# Patient Record
Sex: Female | Born: 1985 | Race: Black or African American | Hispanic: No | Marital: Single | Smoking: Current every day smoker
Health system: Southern US, Community
[De-identification: ages and names within clinical notes are randomized; demographics above are authoritative.]

## PROBLEM LIST (undated history)

## (undated) DIAGNOSIS — Z789 Other specified health status: Secondary | ICD-10-CM

## (undated) HISTORY — PX: NO PAST SURGERIES: SHX2092

## (undated) HISTORY — DX: Other specified health status: Z78.9

---

## 2005-07-21 ENCOUNTER — Emergency Department: Payer: Self-pay | Admitting: General Practice

## 2005-08-04 ENCOUNTER — Emergency Department: Payer: Self-pay | Admitting: Unknown Physician Specialty

## 2005-10-18 ENCOUNTER — Emergency Department: Payer: Self-pay | Admitting: Emergency Medicine

## 2006-09-16 ENCOUNTER — Emergency Department: Payer: Self-pay | Admitting: Emergency Medicine

## 2009-07-16 ENCOUNTER — Emergency Department: Payer: Self-pay | Admitting: Emergency Medicine

## 2009-12-11 ENCOUNTER — Emergency Department: Payer: Self-pay | Admitting: Internal Medicine

## 2012-08-08 ENCOUNTER — Emergency Department: Payer: Self-pay | Admitting: Internal Medicine

## 2014-06-03 ENCOUNTER — Emergency Department: Payer: Self-pay | Admitting: Emergency Medicine

## 2014-06-03 LAB — CBC
HCT: 44.4 % (ref 35.0–47.0)
HGB: 14.4 g/dL (ref 12.0–16.0)
MCH: 31 pg (ref 26.0–34.0)
MCHC: 32.5 g/dL (ref 32.0–36.0)
MCV: 96 fL (ref 80–100)
Platelet: 328 10*3/uL (ref 150–440)
RBC: 4.64 10*6/uL (ref 3.80–5.20)
RDW: 13.4 % (ref 11.5–14.5)
WBC: 7.4 10*3/uL (ref 3.6–11.0)

## 2014-06-03 LAB — GC/CHLAMYDIA PROBE AMP

## 2014-06-03 LAB — WET PREP, GENITAL

## 2015-03-26 ENCOUNTER — Ambulatory Visit
Admission: EM | Admit: 2015-03-26 | Discharge: 2015-03-26 | Disposition: A | Discharge status: Home / Self Care | Payer: BLUE CROSS/BLUE SHIELD | Attending: Internal Medicine | Admitting: Internal Medicine

## 2015-03-26 DIAGNOSIS — R112 Nausea with vomiting, unspecified: Secondary | ICD-10-CM | POA: Diagnosis not present

## 2015-03-26 DIAGNOSIS — Z3201 Encounter for pregnancy test, result positive: Secondary | ICD-10-CM | POA: Diagnosis not present

## 2015-03-26 DIAGNOSIS — R102 Pelvic and perineal pain: Secondary | ICD-10-CM | POA: Diagnosis not present

## 2015-03-26 DIAGNOSIS — Z331 Pregnant state, incidental: Secondary | ICD-10-CM | POA: Diagnosis not present

## 2015-03-26 DIAGNOSIS — Z3490 Encounter for supervision of normal pregnancy, unspecified, unspecified trimester: Secondary | ICD-10-CM

## 2015-03-26 DIAGNOSIS — R110 Nausea: Secondary | ICD-10-CM | POA: Diagnosis present

## 2015-03-26 LAB — URINALYSIS COMPLETE WITH MICROSCOPIC (ARMC ONLY)
BILIRUBIN URINE: NEGATIVE
Bacteria, UA: NONE SEEN — AB
GLUCOSE, UA: NEGATIVE mg/dL
Ketones, ur: NEGATIVE mg/dL
Leukocytes, UA: NEGATIVE
NITRITE: NEGATIVE
PH: 7 (ref 5.0–8.0)
Protein, ur: NEGATIVE mg/dL
SPECIFIC GRAVITY, URINE: 1.02 (ref 1.005–1.030)

## 2015-03-26 LAB — HCG, QUANTITATIVE, PREGNANCY: HCG, BETA CHAIN, QUANT, S: 47 m[IU]/mL — AB (ref <5)

## 2015-03-26 LAB — PREGNANCY, URINE: PREG TEST UR: POSITIVE — AB

## 2015-03-26 MED ORDER — ONDANSETRON 8 MG PO TBDP
8.0000 mg | ORAL_TABLET | Freq: Once | ORAL | Status: AC
  Administered 2015-03-26: 8 mg via ORAL

## 2015-03-26 NOTE — ED Provider Notes (Signed)
CSN: [id number]     Arrival date & time 03/26/15  1343 History   First MD Initiated Contact with Patient 03/26/15 1517     Chief Complaint  Patient presents with  . Nausea  . Emesis  . Headache  29 yo F with early AM nausea and vomiting x 2 days-menses due any day-condom contraception- LMP 02/21/15. Works at [employer] -has not felt sick before now (Consider location/radiation/quality/duration/timing/severity/associated sxs/prior Treatment) Patient is a 29 y.o. female presenting with vomiting and headaches. The history is provided by the patient.  Emesis Severity:  Mild Duration:  2 days Timing:  Intermittent Quality:  Stomach contents Able to tolerate:  Liquids Progression:  Unchanged Chronicity:  New Recent urination:  Normal Relieved by:  Nothing Worsened by:  Food smell Ineffective treatments:  None tried Associated symptoms: headaches and myalgias   Associated symptoms: no arthralgias and no diarrhea   Risk factors: sick contacts   Headache Pain location:  Frontal Quality:  Dull Radiates to:  Does not radiate Severity currently:  5/10 Severity at highest:  10/10 Onset quality:  Gradual Similar to prior headaches: yes   Relieved by:  NSAIDs Worsened by:  Activity Associated symptoms: fatigue, myalgias, nausea and vomiting   Associated symptoms: no diarrhea   Vomiting:    Quality:  Stomach contents   Severity:  Mild   Duration:  2 days   Timing:  Sporadic   Progression:  Unchanged   History reviewed. No pertinent past medical history. History reviewed. No pertinent past surgical history. Family History  Problem Relation Age of Onset  . Hypertension Mother    History  Substance Use Topics  . Smoking status: Current Every Day Smoker -- 0.50 packs/day for 5 years  . Smokeless tobacco: Never Used  . Alcohol Use: No   OB History    No data available     Review of Systems  Constitutional: Positive for appetite change and fatigue.  Eyes: Negative.    Cardiovascular: Negative.   Gastrointestinal: Positive for nausea and vomiting. Negative for diarrhea.  Endocrine: Negative.   Genitourinary: Negative.  Negative for vaginal discharge.  Musculoskeletal: Positive for myalgias. Negative for arthralgias.  Skin: Negative.   Allergic/Immunologic: Negative.   Neurological: Positive for light-headedness and headaches.  Hematological: Negative.   Psychiatric/Behavioral: Negative.   All other systems reviewed and are negative. Mild frontal headache, resolved entirely with AM Aleve po. LMP 02/21/2015    Condom contraception   Never pregnant  Allergies  Review of patient's allergies indicates no known allergies.  Home Medications   Prior to Admission medications   Medication Sig Start Date End Date Taking? Authorizing Provider  naproxen sodium (ANAPROX) 220 MG tablet Take 220 mg by mouth 2 (two) times daily with a meal.   Yes Historical Provider, MD   BP 123/78 mmHg  Pulse 76  Temp(Src) 97 F (36.1 C) (Tympanic)  Resp 20  Ht 5\' 2"  (1.575 m)  Wt 165 lb (74.844 kg)  BMI 30.17 kg/m2  SpO2 100%  LMP 02/23/2015 Physical Exam  Constitutional: She is oriented to person, place, and time. She appears well-developed and well-nourished.  HENT:  Head: Normocephalic and atraumatic.  Eyes: EOM are normal.  Neck: Neck supple. No thyromegaly present.  Cardiovascular: Normal rate and regular rhythm.   Pulmonary/Chest: Effort normal and breath sounds normal.  Abdominal: Bowel sounds are normal.  Musculoskeletal: Normal range of motion.  Neurological: She is alert and oriented to person, place, and time. She has normal reflexes. No cranial  nerve deficit.  Skin: Skin is warm and dry.  Psychiatric: She has a normal mood and affect.  Nursing note and vitals reviewed. Nausea reported and mild frontal headache - CNs as tested are normal- no focal changes no visual changes  ED Course  Procedures (including critical care time) Labs Review Labs  Reviewed  URINALYSIS COMPLETEWITH MICROSCOPIC [facility])  - Abnormal; Notable for the following:    Hgb urine dipstick TRACE (*)    Bacteria, UA NONE SEEN (*)    Squamous Epithelial / LPF 0-5 (*)    All other components within normal limits  PREGNANCY, URINE - Abnormal; Notable for the following:    Preg Test, Ur POSITIVE (*)    All other components within normal limits  HCG, QUANTITATIVE, PREGNANCY - Abnormal; Notable for the following:    hCG, Beta Chain, Quant, S 47 (*)    All other components within normal limits   Results for orders placed or performed during the hospital encounter of 03/26/15  Urinalysis complete, with microscopic  Result Value Ref Range   Color, Urine YELLOW YELLOW   APPearance CLEAR CLEAR   Glucose, UA NEGATIVE NEGATIVE mg/dL   Bilirubin Urine NEGATIVE NEGATIVE   Ketones, ur NEGATIVE NEGATIVE mg/dL   Specific Gravity, Urine 1.020 1.005 - 1.030   Hgb urine dipstick TRACE (A) NEGATIVE   pH 7.0 5.0 - 8.0   Protein, ur NEGATIVE NEGATIVE mg/dL   Nitrite NEGATIVE NEGATIVE   Leukocytes, UA NEGATIVE NEGATIVE   RBC / HPF 0-5 <3 RBC/hpf   WBC, UA 0-5 <3 WBC/hpf   Bacteria, UA NONE SEEN (A) RARE   Squamous Epithelial / LPF 0-5 (A) RARE  Pregnancy, urine  Result Value Ref Range   Preg Test, Ur POSITIVE (A) NEGATIVE  hCG, quantitative, pregnancy  Result Value Ref Range   hCG, Beta Chain, Quant, S 47 (H) <5 mIU/mL    Imaging Review No results found.   MDM   1. Pregnancy   2. Nausea and vomiting, vomiting of unspecified type   3. Pelvic pain in female        Rae HalstedLaurie W Calynn Ferrero, Cordelia Poche-C 03/26/15 1950

## 2015-03-26 NOTE — ED Notes (Signed)
Patient with complaint of N/V for the past few days abd pain, pelvic pain.

## 2015-03-26 NOTE — ED Notes (Signed)
Patient feeling better after zofran, nausea gone, given some ginger ale to assess tolerance. Earvin HansenL. Lee aware.

## 2015-03-26 NOTE — Discharge Instructions (Signed)
The results of your pregnancy test indicate that you probably conceived within the last week. The growth and development at this time is still very tiny and an ultrasound tonight  is not likely to be particularly informational. Your pelvic fullness can be associated with the congestion of early pregnancy- just like you reported it feels very much like just before you start your period. If you develop pelvic pain or increased abdominal pain please go directly to the Emergency Room. If you feel well the next 2 days please call the OB-GYN of your choice and establish care. Take the lab information that I gave you with you to that appointment- and you may need to read the numbers to them over the phone. Please remember healthy food choices and no drugs or alcohol. Your OB-GYN will have care information specific to their office when you see them.  Congratulations and best wishes ! Thank you for choosing us for your care this evening !

## 2015-03-28 ENCOUNTER — Emergency Department
Admission: EM | Admit: 2015-03-28 | Discharge: 2015-03-28 | Disposition: A | Discharge status: Home / Self Care | Payer: BLUE CROSS/BLUE SHIELD | Attending: Emergency Medicine | Admitting: Emergency Medicine

## 2015-03-28 ENCOUNTER — Emergency Department: Payer: BLUE CROSS/BLUE SHIELD

## 2015-03-28 DIAGNOSIS — A5901 Trichomonal vulvovaginitis: Secondary | ICD-10-CM | POA: Diagnosis not present

## 2015-03-28 DIAGNOSIS — Z79899 Other long term (current) drug therapy: Secondary | ICD-10-CM | POA: Insufficient documentation

## 2015-03-28 DIAGNOSIS — Z3A00 Weeks of gestation of pregnancy not specified: Secondary | ICD-10-CM | POA: Insufficient documentation

## 2015-03-28 DIAGNOSIS — F17210 Nicotine dependence, cigarettes, uncomplicated: Secondary | ICD-10-CM | POA: Insufficient documentation

## 2015-03-28 DIAGNOSIS — O23591 Infection of other part of genital tract in pregnancy, first trimester: Secondary | ICD-10-CM | POA: Insufficient documentation

## 2015-03-28 DIAGNOSIS — O99331 Smoking (tobacco) complicating pregnancy, first trimester: Secondary | ICD-10-CM | POA: Diagnosis not present

## 2015-03-28 DIAGNOSIS — O9989 Other specified diseases and conditions complicating pregnancy, childbirth and the puerperium: Secondary | ICD-10-CM | POA: Diagnosis present

## 2015-03-28 DIAGNOSIS — O210 Mild hyperemesis gravidarum: Secondary | ICD-10-CM | POA: Insufficient documentation

## 2015-03-28 DIAGNOSIS — Z3491 Encounter for supervision of normal pregnancy, unspecified, first trimester: Secondary | ICD-10-CM

## 2015-03-28 LAB — CBC WITH DIFFERENTIAL/PLATELET
Basophils Absolute: 0.1 10*3/uL (ref 0–0.1)
Basophils Relative: 1 %
EOS PCT: 3 %
Eosinophils Absolute: 0.2 10*3/uL (ref 0–0.7)
HCT: 42.8 % (ref 35.0–47.0)
HEMOGLOBIN: 14 g/dL (ref 12.0–16.0)
LYMPHS PCT: 38 %
Lymphs Abs: 2.5 10*3/uL (ref 1.0–3.6)
MCH: 30.1 pg (ref 26.0–34.0)
MCHC: 32.7 g/dL (ref 32.0–36.0)
MCV: 92 fL (ref 80.0–100.0)
Monocytes Absolute: 0.5 10*3/uL (ref 0.2–0.9)
Monocytes Relative: 7 %
NEUTROS ABS: 3.4 10*3/uL (ref 1.4–6.5)
Neutrophils Relative %: 51 %
Platelets: 393 10*3/uL (ref 150–440)
RBC: 4.65 MIL/uL (ref 3.80–5.20)
RDW: 12.9 % (ref 11.5–14.5)
WBC: 6.7 10*3/uL (ref 3.6–11.0)

## 2015-03-28 LAB — COMPREHENSIVE METABOLIC PANEL
ALT: 18 U/L (ref 14–54)
AST: 23 U/L (ref 15–41)
Albumin: 4.3 g/dL (ref 3.5–5.0)
Alkaline Phosphatase: 59 U/L (ref 38–126)
Anion gap: 8 (ref 5–15)
BUN: 13 mg/dL (ref 6–20)
CHLORIDE: 106 mmol/L (ref 101–111)
CO2: 21 mmol/L — AB (ref 22–32)
CREATININE: 0.64 mg/dL (ref 0.44–1.00)
Calcium: 9.3 mg/dL (ref 8.9–10.3)
GFR calc Af Amer: >60 mL/min (ref >60)
Glucose, Bld: 107 mg/dL — ABNORMAL HIGH (ref 65–99)
Potassium: 3.9 mmol/L (ref 3.5–5.1)
Sodium: 135 mmol/L (ref 135–145)
Total Bilirubin: 0.6 mg/dL (ref 0.3–1.2)
Total Protein: 7.5 g/dL (ref 6.5–8.1)

## 2015-03-28 LAB — URINALYSIS COMPLETE WITH MICROSCOPIC (ARMC ONLY)
Bilirubin Urine: NEGATIVE
Glucose, UA: NEGATIVE mg/dL
KETONES UR: NEGATIVE mg/dL
Leukocytes, UA: NEGATIVE
NITRITE: NEGATIVE
Protein, ur: NEGATIVE mg/dL
Specific Gravity, Urine: 1.018 (ref 1.005–1.030)
pH: 6 (ref 5.0–8.0)

## 2015-03-28 LAB — WET PREP, GENITAL
Clue Cells Wet Prep HPF POC: NONE SEEN
Yeast Wet Prep HPF POC: NONE SEEN

## 2015-03-28 LAB — OB RESULTS CONSOLE GC/CHLAMYDIA
CHLAMYDIA, DNA PROBE: NEGATIVE
GC PROBE AMP, GENITAL: NEGATIVE

## 2015-03-28 LAB — CHLAMYDIA/NGC RT PCR (ARMC ONLY)
Chlamydia Tr: NOT DETECTED
N gonorrhoeae: NOT DETECTED

## 2015-03-28 LAB — PREGNANCY, URINE: Preg Test, Ur: POSITIVE — AB

## 2015-03-28 LAB — LIPASE, BLOOD: LIPASE: 26 U/L (ref 22–51)

## 2015-03-28 LAB — HCG, QUANTITATIVE, PREGNANCY: hCG, Beta Chain, Quant, S: 107 m[IU]/mL — ABNORMAL HIGH (ref <5)

## 2015-03-28 MED ORDER — ONDANSETRON 4 MG PO TBDP
ORAL_TABLET | ORAL | Status: AC
  Administered 2015-03-28: 4 mg via ORAL
  Filled 2015-03-28: qty 4

## 2015-03-28 MED ORDER — ONDANSETRON 4 MG PO TBDP
4.0000 mg | ORAL_TABLET | Freq: Once | ORAL | Status: AC
  Administered 2015-03-28: 4 mg via ORAL

## 2015-03-28 MED ORDER — METRONIDAZOLE 500 MG PO TABS
2000.0000 mg | ORAL_TABLET | Freq: Once | ORAL | Status: AC
  Administered 2015-03-28: 2000 mg via ORAL

## 2015-03-28 MED ORDER — ONDANSETRON HCL 4 MG PO TABS: 4.0000 mg | ORAL_TABLET | Freq: Every day | ORAL | Status: DC | PRN

## 2015-03-28 MED ORDER — METRONIDAZOLE 500 MG PO TABS
ORAL_TABLET | ORAL | Status: AC
  Administered 2015-03-28: 2000 mg via ORAL
  Filled 2015-03-28: qty 4

## 2015-03-28 NOTE — ED Provider Notes (Signed)
[facility] Emergency Department Provider Note    Time seen: 12:00   I have reviewed the triage vital signs and the nursing notes.   HISTORY  Chief Complaint Abdominal Pain    HPI Isabel Weber is a [age] y.o. female who presents here for abdominal pain and vomiting for the last week. Patient states pain is suprapubic sharp, nothing makes it better or worse. States that she just found out at urgent care today that she is pregnant. Also complains of pain in the pelvic region that slight pulling. Pain is currently mild does not want anything for pain. This is her first pregnancy is is G1 P0    History reviewed. No pertinent past medical history.  There are no active problems to display for this patient.   History reviewed. No pertinent past surgical history.  Current Outpatient Rx  Name  Route  Sig  Dispense  Refill  . Prenatal Vit-Fe Fumarate-FA (PRENATAL MULTIVITAMIN) TABS tablet   Oral   Take 1 tablet by mouth daily at 12 noon.         . naproxen sodium (ANAPROX) 220 MG tablet   Oral   Take 220 mg by mouth 2 (two) times daily as needed (pain).            Allergies Review of patient's allergies indicates no known allergies.  Family History  Problem Relation Age of Onset  . Hypertension Mother     Social History History  Substance Use Topics  . Smoking status: Current Every Day Smoker -- 0.50 packs/day for 5 years    Types: Cigarettes  . Smokeless tobacco: Never Used  . Alcohol Use: No    Review of Systems Constitutional: Negative for fever. Eyes: Negative for visual changes. ENT: Negative for sore throat. Cardiovascular: Negative for chest pain. Respiratory: Negative for shortness of breath. Gastrointestinal: Abdominal pain, positive for vomiting and negative for diarrhea. Genitourinary: Negative for dysuria, denies any vaginal bleeding Musculoskeletal: Negative for back pain. Skin: Negative for rash. Neurological: Negative  for headaches, focal weakness or numbness.  10-point ROS otherwise negative.  ____________________________________________   PHYSICAL EXAM:  VITAL SIGNS: ED Triage Vitals  Enc Vitals Group     BP 03/28/15 1055 127/78 mmHg     Pulse Rate 03/28/15 1055 86     Resp 03/28/15 1055 18     Temp 03/28/15 1055 98.2 F (36.8 C)     Temp Source 03/28/15 1055 Oral     SpO2 03/28/15 1055 100 %     Weight 03/28/15 1055 165 lb (74.844 kg)     Height 03/28/15 1055 5\' 2"  (1.575 m)     Head Cir --      Peak Flow --      Pain Score 03/28/15 1055 5     Pain Loc --      Pain Edu? --      Excl. in GC? --     Constitutional: Alert and oriented. Well appearing and in no distress. Eyes: Conjunctivae are normal. PERRL. Normal extraocular movements. ENT   Head: Normocephalic and atraumatic.   Nose: No congestion/rhinnorhea.   Mouth/Throat: Mucous membranes are moist.   Neck: No stridor. Hematological/Lymphatic/Immunilogical: No cervical lymphadenopathy. Cardiovascular: Normal rate, regular rhythm. Normal and symmetric distal pulses are present in all extremities. No murmurs, rubs, or gallops. Respiratory: Normal respiratory effort without tachypnea nor retractions. Breath sounds are clear and equal bilaterally. No wheezes/rales/rhonchi. Gastrointestinal: Soft and nontender. No distention. No abdominal bruits. There is  no CVA tenderness. Genitourinary: There is no cervical motion tenderness no signs of cervicitis no adnexal tenderness no vaginal discharge. Musculoskeletal: Nontender with normal range of motion in all extremities. No joint effusions.  No lower extremity tenderness nor edema. Neurologic:  Normal speech and language. No gross focal neurologic deficits are appreciated. Speech is normal. No gait instability. Skin:  Skin is warm, dry and intact. No rash noted. Psychiatric: Mood and affect are normal. Speech and behavior are normal. Patient exhibits appropriate insight and  judgment.  ____________________________________________    LABS (pertinent positives/negatives)  Labs Reviewed  WET PREP, GENITAL - Abnormal; Notable for the following:    Trich, Wet Prep MODERATE (*)    WBC, Wet Prep HPF POC FEW (*)    All other components within normal limits  COMPREHENSIVE METABOLIC PANEL - Abnormal; Notable for the following:    CO2 21 (*)    Glucose, Bld 107 (*)    All other components within normal limits  URINALYSIS COMPLETEWITH MICROSCOPIC ([facility])  - Abnormal; Notable for the following:    Color, Urine YELLOW (*)    APPearance CLEAR (*)    Hgb urine dipstick 1+ (*)    Bacteria, UA RARE (*)    Squamous Epithelial / LPF 0-5 (*)    All other components within normal limits  PREGNANCY, URINE - Abnormal; Notable for the following:    Preg Test, Ur POSITIVE (*)    All other components within normal limits  CHLAMYDIA/NGC RT PCR ([facility])   CBC WITH DIFFERENTIAL/PLATELET  CBC WITH DIFFERENTIAL/PLATELET  LIPASE, BLOOD  RPR  HCG, QUANTITATIVE, PREGNANCY  GC/CHLAMYDIA PROBE AMP ([place])     ____________________________________________    RADIOLOGY  Pelvic ultrasound:  ____________________________________________    ED COURSE  Pertinent labs & imaging results that were available during my care of the patient were reviewed by me and considered in my medical decision making (see chart for details).  We'll perform basic tests and ultrasound will reevaluate.  FINAL ASSESSMENT AND PLAN  Assessment: First trimester pregnancy and Trichomonas  Plan: Patient is given 2 g of Flagyl here by mouth and we'll prescribe Zofran as needed for nausea and vomiting in pregnancy she is in no acute distress she is advised to abstain from sexual activity until her sexual partners treated.    Emily FilbertWilliams, Jonathan E, MD   Emily FilbertJonathan E Williams, MD 03/28/15 [phone]

## 2015-03-28 NOTE — ED Notes (Signed)
Pt reports abdominal pain and vomiting x 1 week. Reports went to urgent care where they told her she was pregnant and sent here for US evaluation to rule out ectopic due to patient complaining of pain in pelvic region "like pulling".

## 2015-03-28 NOTE — Discharge Instructions (Signed)
Trichomoniasis Trichomoniasis is an infection caused by an organism called Trichomonas. The infection can affect both women and men. In women, the outer female genitalia and the vagina are affected. In men, the penis is mainly affected, but the prostate and other reproductive organs can also be involved. Trichomoniasis is a sexually transmitted infection (STI) and is most often passed to another person through sexual contact.  RISK FACTORS  Having unprotected sexual intercourse.  Having sexual intercourse with an infected partner. SIGNS AND SYMPTOMS  Symptoms of trichomoniasis in women include:  Abnormal gray-green frothy vaginal discharge.  Itching and irritation of the vagina.  Itching and irritation of the area outside the vagina. Symptoms of trichomoniasis in men include:   Penile discharge with or without pain.  Pain during urination. This results from inflammation of the urethra. DIAGNOSIS  Trichomoniasis may be found during a Pap test or physical exam. Your health care provider may use one of the following methods to help diagnose this infection:  Examining vaginal discharge under a microscope. For men, urethral discharge would be examined.  Testing the pH of the vagina with a test tape.  Using a vaginal swab test that checks for the Trichomonas organism. A test is available that provides results within a few minutes.  Doing a culture test for the organism. This is not usually needed. TREATMENT   You may be given medicine to fight the infection. Women should inform their health care provider if they could be or are pregnant. Some medicines used to treat the infection should not be taken during pregnancy.  Your health care provider may recommend over-the-counter medicines or creams to decrease itching or irritation.  Your sexual partner will need to be treated if infected. HOME CARE INSTRUCTIONS   Take medicines only as directed by your health care provider.  Take  over-the-counter medicine for itching or irritation as directed by your health care provider.  Do not have sexual intercourse while you have the infection.  Women should not douche or wear tampons while they have the infection.  Discuss your infection with your partner. Your partner may have gotten the infection from you, or you may have gotten it from your partner.  Have your sex partner get examined and treated if necessary.  Practice safe, informed, and protected sex.  See your health care provider for other STI testing. SEEK MEDICAL CARE IF:   You still have symptoms after you finish your medicine.  You develop abdominal pain.  You have pain when you urinate.  You have bleeding after sexual intercourse.  You develop a rash.  Your medicine makes you sick or makes you throw up (vomit). MAKE SURE YOU:  Understand these instructions.  Will watch your condition.  Will get help right away if you are not doing well or get worse. Document Released: 05/01/2001 Document Revised: 03/22/2014 Document Reviewed: 08/17/2013 [facility] Patient Information 2015 [place], [place]. This information is not intended to replace advice given to you by your health care provider. Make sure you discuss any questions you have with your health care provider.  Prenatal Care  WHAT IS PRENATAL CARE?  Prenatal care means health care during your pregnancy, before your baby is born. It is very important to take care of yourself and your baby during your pregnancy by:   Getting early prenatal care. If you know you are pregnant, or think you might be pregnant, call your health care provider as soon as possible. Schedule a visit for a prenatal exam.  Getting  regular prenatal care. Follow your health care provider's schedule for blood and other necessary tests. Do not miss appointments.  Doing everything you can to keep yourself and your baby healthy during your pregnancy.  Getting complete care. Prenatal  care should include evaluation of the medical, dietary, educational, psychological, and social needs of you and your significant other. The medical and genetic history of your family and the family of your baby's father should be discussed with your health care provider.  Discussing with your health care provider:  Prescription, over-the-counter, and herbal medicines that you take.  Any history of substance abuse, alcohol use, smoking, and illegal drug use.  Any history of domestic abuse and violence.  Immunizations you have received.  Your nutrition and diet.  The amount of exercise you do.  Any environmental and occupational hazards to which you are exposed.  History of sexually transmitted infections for both you and your partner.  Previous pregnancies you have had. WHY IS PRENATAL CARE SO IMPORTANT?  By regularly seeing your health care provider, you help ensure that problems can be identified early so that they can be treated as soon as possible. Other problems might be prevented. Many studies have shown that early and regular prenatal care is important for the health of mothers and their babies.  HOW CAN I TAKE CARE OF MYSELF WHILE I AM PREGNANT?  Here are ways to take care of yourself and your baby:   Start or continue taking your multivitamin with 400 micrograms (mcg) of folic acid every day.  Get early and regular prenatal care. It is very important to see a health care provider during your pregnancy. Your health care provider will check at each visit to make sure that you and your baby are healthy. If there are any problems, action can be taken right away to help you and your baby.  Eat a healthy diet that includes:  Fruits.  Vegetables.  Foods low in saturated fat.  Whole grains.  Calcium-rich foods, such as milk, yogurt, and hard cheeses.  Drink 6-8 glasses of liquids a day.  Unless your health care provider tells you not to, try to be physically active for 30  minutes, most days of the week. If you are pressed for time, you can get your activity in through 10-minute segments, three times a day.  Do not smoke, drink alcohol, or use drugs. These can cause long-term damage to your baby. Talk with your health care provider about steps to take to stop smoking. Talk with a member of your faith community, a counselor, a trusted friend, or your health care provider if you are concerned about your alcohol or drug use.  Ask your health care provider before taking any medicine, even over-the-counter medicines. Some medicines are not safe to take during pregnancy.  Get plenty of rest and sleep.  Avoid hot tubs and saunas during pregnancy.  Do not have X-rays taken unless absolutely necessary and with the recommendation of your health care provider. A lead shield can be placed on your abdomen to protect your baby when X-rays are taken in other parts of your body.  Do not empty the cat litter when you are pregnant. It may contain a parasite that causes an infection called toxoplasmosis, which can cause birth defects. Also, use gloves when working in garden areas used by cats.  Do not eat uncooked or undercooked meats or fish.  Do not eat soft, mold-ripened cheeses (Brie, Camembert, and chevre) or soft, blue-veined cheese (  Danish blue and Roquefort).  Stay away from toxic chemicals like:  Insecticides.  Solvents (some cleaners or paint thinners).  Lead.  Mercury.  Sexual intercourse may continue until the end of the pregnancy, unless you have a medical problem or there is a problem with the pregnancy and your health care provider tells you not to.  Do not wear high-heel shoes, especially during the second half of the pregnancy. You can lose your balance and fall.  Do not take long trips, unless absolutely necessary. Be sure to see your health care provider before going on the trip.  Do not sit in one position for more than 2 hours when on a  trip.  Take a copy of your medical records when going on a trip. Know where a hospital is located in the city you are visiting, in case of an emergency.  Most dangerous household products will have pregnancy warnings on their labels. Ask your health care provider about products if you are unsure.  Limit or eliminate your caffeine intake from coffee, tea, sodas, medicines, and chocolate.  Many women continue working through pregnancy. Staying active might help you stay healthier. If you have a question about the safety or the hours you work at your particular job, talk with your health care provider.  Get informed:  Read books.  Watch videos.  Go to childbirth classes for you and your significant other.  Talk with experienced moms.  Ask your health care provider about childbirth education classes for you and your partner. Classes can help you and your partner prepare for the birth of your baby.  Ask about a baby doctor (pediatrician) and methods and pain medicine for labor, delivery, and possible cesarean delivery. HOW OFTEN SHOULD I SEE MY HEALTH CARE PROVIDER DURING PREGNANCY?  Your health care provider will give you a schedule for your prenatal visits. You will have visits more often as you get closer to the end of your pregnancy. An average pregnancy lasts about 40 weeks.  A typical schedule includes visiting your health care provider:   About once each month during your first 6 months of pregnancy.  Every 2 weeks during the next 2 months.  Weekly in the last month, until the delivery date. Your health care provider will probably want to see you more often if:  You are older than 35 years.  Your pregnancy is high risk because you have certain health problems or problems with the pregnancy, such as:  Diabetes.  High blood pressure.  The baby is not growing on schedule, according to the dates of the pregnancy. Your health care provider will do special tests to make sure  you and your baby are not having any serious problems. WHAT HAPPENS DURING PRENATAL VISITS?   At your first prenatal visit, your health care provider will do a physical exam and talk to you about your health history and the health history of your partner and your family. Your health care provider will be able to tell you what date to expect your baby to be born on.  Your first physical exam will include checks of your blood pressure, measurements of your height and weight, and an exam of your pelvic organs. Your health care provider will do a Pap test if you have not had one recently and will do cultures of your cervix to make sure there is no infection.  At each prenatal visit, there will be tests of your blood, urine, blood pressure, weight, and the progress  of the baby will be checked.  At your later prenatal visits, your health care provider will check how you are doing and how your baby is developing. You may have a number of tests done as your pregnancy progresses.  Ultrasound exams are often used to check on your baby's growth and health.  You may have more urine and blood tests, as well as special tests, if needed. These may include amniocentesis to examine fluid in the pregnancy sac, stress tests to check how the baby responds to contractions, or a biophysical profile to measure your baby's well-being. Your health care provider will explain the tests and why they are necessary.  You should be tested for high blood sugar (gestational diabetes) between the 24th and 28th weeks of your pregnancy.  You should discuss with your health care provider your plans to breastfeed or bottle-feed your baby.  Each visit is also a chance for you to learn about staying healthy during pregnancy and to ask questions. Document Released: 11/08/2003 Document Revised: 11/10/2013 Document Reviewed: 01/20/2014 [facility] AreaExitCare Patient Information 2015 [place] CenterExitCare, [place]LL[place]. This information is not intended to replace advice  given to you by your health care provider. Make sure you discuss any questions you have with your health care provider.

## 2015-03-29 LAB — RPR: RPR: NONREACTIVE

## 2015-04-24 ENCOUNTER — Encounter: Payer: Self-pay | Admitting: Emergency Medicine

## 2015-04-24 ENCOUNTER — Emergency Department
Admission: EM | Admit: 2015-04-24 | Discharge: 2015-04-24 | Disposition: A | Discharge status: Home / Self Care | Payer: BLUE CROSS/BLUE SHIELD | Attending: Emergency Medicine | Admitting: Emergency Medicine

## 2015-04-24 ENCOUNTER — Emergency Department: Payer: BLUE CROSS/BLUE SHIELD

## 2015-04-24 DIAGNOSIS — O209 Hemorrhage in early pregnancy, unspecified: Secondary | ICD-10-CM | POA: Diagnosis present

## 2015-04-24 DIAGNOSIS — Z3A08 8 weeks gestation of pregnancy: Secondary | ICD-10-CM | POA: Diagnosis not present

## 2015-04-24 DIAGNOSIS — Z87891 Personal history of nicotine dependence: Secondary | ICD-10-CM | POA: Diagnosis not present

## 2015-04-24 DIAGNOSIS — Z79899 Other long term (current) drug therapy: Secondary | ICD-10-CM | POA: Insufficient documentation

## 2015-04-24 DIAGNOSIS — O200 Threatened abortion: Secondary | ICD-10-CM | POA: Diagnosis not present

## 2015-04-24 LAB — CBC WITH DIFFERENTIAL/PLATELET
BASOS PCT: 1 %
Basophils Absolute: 0.1 10*3/uL (ref 0–0.1)
Eosinophils Absolute: 0.3 10*3/uL (ref 0–0.7)
Eosinophils Relative: 5 %
HCT: 33.9 % — ABNORMAL LOW (ref 35.0–47.0)
HEMOGLOBIN: 11.5 g/dL — AB (ref 12.0–16.0)
LYMPHS PCT: 35 %
Lymphs Abs: 2.4 10*3/uL (ref 1.0–3.6)
MCH: 30.6 pg (ref 26.0–34.0)
MCHC: 33.8 g/dL (ref 32.0–36.0)
MCV: 90.5 fL (ref 80.0–100.0)
MONO ABS: 0.5 10*3/uL (ref 0.2–0.9)
MONOS PCT: 8 %
NEUTROS ABS: 3.5 10*3/uL (ref 1.4–6.5)
Neutrophils Relative %: 51 %
Platelets: 399 10*3/uL (ref 150–440)
RBC: 3.75 MIL/uL — ABNORMAL LOW (ref 3.80–5.20)
RDW: 12.7 % (ref 11.5–14.5)
WBC: 6.9 10*3/uL (ref 3.6–11.0)

## 2015-04-24 LAB — POCT PREGNANCY, URINE: PREG TEST UR: POSITIVE — AB

## 2015-04-24 LAB — COMPREHENSIVE METABOLIC PANEL
ALBUMIN: 3.3 g/dL — AB (ref 3.5–5.0)
ALK PHOS: 45 U/L (ref 38–126)
ALT: 24 U/L (ref 14–54)
AST: 26 U/L (ref 15–41)
Anion gap: 9 (ref 5–15)
BUN: 11 mg/dL (ref 6–20)
CO2: 22 mmol/L (ref 22–32)
Calcium: 8.8 mg/dL — ABNORMAL LOW (ref 8.9–10.3)
Chloride: 104 mmol/L (ref 101–111)
Creatinine, Ser: 0.65 mg/dL (ref 0.44–1.00)
GFR calc Af Amer: >60 mL/min (ref >60)
GFR calc non Af Amer: >60 mL/min (ref >60)
Glucose, Bld: 120 mg/dL — ABNORMAL HIGH (ref 65–99)
Potassium: 3.7 mmol/L (ref 3.5–5.1)
Sodium: 135 mmol/L (ref 135–145)
TOTAL PROTEIN: 6.3 g/dL — AB (ref 6.5–8.1)
Total Bilirubin: 0.4 mg/dL (ref 0.3–1.2)

## 2015-04-24 LAB — HCG, QUANTITATIVE, PREGNANCY: hCG, Beta Chain, Quant, S: 101516 m[IU]/mL — ABNORMAL HIGH (ref <5)

## 2015-04-24 LAB — ABO/RH
ABO/RH(D): O POS
ABO/RH(D): O POS

## 2015-04-24 NOTE — ED Notes (Signed)
Pt informed to return if life threatening symptoms occur.   

## 2015-04-24 NOTE — ED Notes (Signed)
Pt returned from CT °

## 2015-04-24 NOTE — ED Notes (Signed)
Pt with vaginal spotting this am. Pt is nine weeks pregnant.

## 2015-04-24 NOTE — ED Notes (Signed)
Patient transported to Ultrasound 

## 2015-04-24 NOTE — ED Provider Notes (Signed)
[facility] Emergency Department Provider Note  Time seen: 9:32 AM  I have reviewed the triage vital signs and the nursing notes.   HISTORY  Chief Complaint Vaginal Bleeding    HPI Isabel Weber is a 29 y.o. female who is currently [redacted] weeks pregnant presents the emergency department with vaginal bleeding. According to the patient she is [redacted] weeks pregnant, followed by her OB/GYN. The patient had intercourse with her fianc last night and this morning noticed vaginal spotting so she came to the emergency department for evaluation. Patient does have a history of a prior miscarriage at 6 weeks. Denies any abdominal pain or cramping today. Denies any blood clots or tissue passage.    History reviewed. No pertinent past medical history.  There are no active problems to display for this patient.   History reviewed. No pertinent past surgical history.  Current Outpatient Rx  Name  Route  Sig  Dispense  Refill  . naproxen sodium (ANAPROX) 220 MG tablet   Oral   Take 220 mg by mouth 2 (two) times daily as needed (pain).          . ondansetron (ZOFRAN) 4 MG tablet   Oral   Take 1 tablet (4 mg total) by mouth daily as needed for nausea or vomiting.   30 tablet   1   . Prenatal Vit-Fe Fumarate-FA (PRENATAL MULTIVITAMIN) TABS tablet   Oral   Take 1 tablet by mouth daily at 12 noon.           Allergies Review of patient's allergies indicates no known allergies.  Family History  Problem Relation Age of Onset  . Hypertension Mother     Social History History  Substance Use Topics  . Smoking status: Former Smoker -- 0.50 packs/day for 5 years    Types: Cigarettes  . Smokeless tobacco: Never Used  . Alcohol Use: No    Review of Systems Constitutional: Negative for fever. Cardiovascular: Negative for chest pain. Respiratory: Negative for shortness of breath. Gastrointestinal: Negative for abdominal pain, vomiting and diarrhea. Genitourinary:  Negative for dysuria. Positive for vaginal spotting. Musculoskeletal: Negative for back pain.  10-point ROS otherwise negative.  ____________________________________________   PHYSICAL EXAM:  VITAL SIGNS: ED Triage Vitals  Enc Vitals Group     BP 04/24/15 0919 125/58 mmHg     Pulse Rate 04/24/15 0919 76     Resp 04/24/15 0919 18     Temp 04/24/15 0919 98.7 F (37.1 C)     Temp Source 04/24/15 0919 Oral     SpO2 04/24/15 0919 99 %     Weight 04/24/15 0919 165 lb (74.844 kg)     Height 04/24/15 0919  (1.575 m)     Head Cir --      Peak Flow --      Pain Score 04/24/15 0920 4     Pain Loc --      Pain Edu? --      Excl. in GC? --     Constitutional: Alert and oriented. Well appearing and in no distress. ENT   Head: Normocephalic and atraumatic.   Mouth/Throat: Mucous membranes are moist. Cardiovascular: Normal rate, regular rhythm. No murmurs Respiratory: Normal respiratory effort without tachypnea nor retractions. Breath sounds are clear  Gastrointestinal: Soft and nontender. No distention.   Musculoskeletal: Nontender with normal range of motion in all extremities.  Neurologic:  Normal speech and language. No gross focal neurologic deficits  Psychiatric: Mood and affect  are normal. Speech and behavior are normal.  ____________________________________________      RADIOLOGY  Ultrasound consistent with 7 week pregnancy.  ____________________________________________   INITIAL IMPRESSION / ASSESSMENT AND PLAN / ED COURSE  Pertinent labs & imaging results that were available during my care of the patient were reviewed by me and considered in my medical decision making (see chart for details).  Patient with a threatened miscarriage. Bleeding likely due to sexual intercourse. We will check labs, blood type, and an ultrasound to further evaluate. Patient denies any pain at this time. Largely negative review of systems. Overall patient appears very  well.  Labs are within normal limits, ultrasound consistent with 7 week 4 day pregnancy. We will discharge home with OB follow-up. Patient agreeable to plan.  ____________________________________________   FINAL CLINICAL IMPRESSION(S) / ED DIAGNOSES  Threatened miscarriage   Minna AntisKevin Ladona Rosten, MD 04/24/15 1153

## 2015-04-24 NOTE — Discharge Instructions (Signed)
Threatened Miscarriage A threatened miscarriage occurs when you have vaginal bleeding during your first 20 weeks of pregnancy but the pregnancy has not ended. If you have vaginal bleeding during this time, your health care provider will do tests to make sure you are still pregnant. If the tests show you are still pregnant and the developing baby (fetus) inside your womb (uterus) is still growing, your condition is considered a threatened miscarriage. A threatened miscarriage does not mean your pregnancy will end, but it does increase the risk of losing your pregnancy (complete miscarriage). CAUSES  The cause of a threatened miscarriage is usually not known. If you go on to have a complete miscarriage, the most common cause is an abnormal number of chromosomes in the developing baby. Chromosomes are the structures inside cells that hold all your genetic material. Some causes of vaginal bleeding that do not result in miscarriage include:  Having sex.  Having an infection.  Normal hormone changes of pregnancy.  Bleeding that occurs when an egg implants in your uterus. RISK FACTORS Risk factors for bleeding in early pregnancy include:  Obesity.  Smoking.  Drinking excessive amounts of alcohol or caffeine.  Recreational drug use. SIGNS AND SYMPTOMS  Light vaginal bleeding.  Mild abdominal pain or cramps. DIAGNOSIS  If you have bleeding with or without abdominal pain before 20 weeks of pregnancy, your health care provider will do tests to check whether you are still pregnant. One important test involves using sound waves and a computer (ultrasound) to create images of the inside of your uterus. Other tests include an internal exam of your vagina and uterus (pelvic exam) and measurement of your baby's heart rate.  You may be diagnosed with a threatened miscarriage if:  Ultrasound testing shows you are still pregnant.  Your baby's heart rate is strong.  A pelvic exam shows that the  opening between your uterus and your vagina (cervix) is closed.  Your heart rate and blood pressure are stable.  Blood tests confirm you are still pregnant. TREATMENT  No treatments have been shown to prevent a threatened miscarriage from going on to a complete miscarriage. However, the right home care is important.  HOME CARE INSTRUCTIONS   Make sure you keep all your appointments for prenatal care. This is very important.  Get plenty of rest.  Do not have sex or use tampons if you have vaginal bleeding.  Do not douche.  Do not smoke or use recreational drugs.  Do not drink alcohol.  Avoid caffeine. SEEK MEDICAL CARE IF:  You have light vaginal bleeding or spotting while pregnant.  You have abdominal pain or cramping.  You have a fever. SEEK IMMEDIATE MEDICAL CARE IF:  You have heavy vaginal bleeding.  You have blood clots coming from your vagina.  You have severe low back pain or abdominal cramps.  You have fever, chills, and severe abdominal pain. MAKE SURE YOU:  Understand these instructions.  Will watch your condition.  Will get help right away if you are not doing well or get worse. Document Released: 11/05/2005 Document Revised: 11/10/2013 Document Reviewed: 09/01/2013 [facility]Exit[facility] Patient Information 2015 [place], [place]LL[place]. This information is not intended to replace advice given to you by your health care provider. Make sure you discuss any questions you have with your health care provider.     As we have discussed her workup today shows largely normal results. Please follow-up with your OB/GYN in 48 hours. Recheck appear beta hCG (pregnancy hormone level). Today's levels 101,500.  Return to the emergency department for any abdominal pain, or any [place] concerning symptoms.

## 2015-04-26 ENCOUNTER — Ambulatory Visit: Payer: BLUE CROSS/BLUE SHIELD | Admitting: *Deleted

## 2015-04-26 VITALS — BP 120/74 | HR 92 | Wt 164.8 lb

## 2015-04-26 DIAGNOSIS — Z3491 Encounter for supervision of normal pregnancy, unspecified, first trimester: Secondary | ICD-10-CM

## 2015-04-26 NOTE — Progress Notes (Signed)
Pt is here for her NOB nurse intake

## 2015-04-27 LAB — PRENATAL PROFILE I(LABCORP)
Antibody Screen: NEGATIVE
BASOS ABS: 0 10*3/uL (ref 0.0–0.2)
Basos: 0 %
EOS (ABSOLUTE): 0.3 10*3/uL (ref 0.0–0.4)
Eos: 5 %
HEMATOCRIT: 35.4 % (ref 34.0–46.6)
HEP B S AG: NEGATIVE
Hemoglobin: 11.6 g/dL (ref 11.1–15.9)
IMMATURE GRANULOCYTES: 0 %
Immature Grans (Abs): 0 10*3/uL (ref 0.0–0.1)
LYMPHS ABS: 2.3 10*3/uL (ref 0.7–3.1)
Lymphs: 33 %
MCH: 29.3 pg (ref 26.6–33.0)
MCHC: 32.8 g/dL (ref 31.5–35.7)
MCV: 89 fL (ref 79–97)
MONOCYTES: 9 %
Monocytes Absolute: 0.6 10*3/uL (ref 0.1–0.9)
NEUTROS ABS: 3.6 10*3/uL (ref 1.4–7.0)
Neutrophils: 53 %
PLATELETS: 352 10*3/uL (ref 150–379)
RBC: 3.96 x10E6/uL (ref 3.77–5.28)
RDW: 12.6 % (ref 12.3–15.4)
RH TYPE: POSITIVE
RPR Ser Ql: NONREACTIVE
Rubella Antibodies, IGG: 1.18 index (ref >0.99)
WBC: 6.9 10*3/uL (ref 3.4–10.8)

## 2015-04-27 LAB — SICKLE CELL SCREEN: Sickle Cell Screen: NEGATIVE

## 2015-05-04 ENCOUNTER — Encounter: Payer: Self-pay | Admitting: Obstetrics & Gynecology

## 2015-05-04 ENCOUNTER — Ambulatory Visit (INDEPENDENT_AMBULATORY_CARE_PROVIDER_SITE_OTHER): Payer: BLUE CROSS/BLUE SHIELD | Admitting: Obstetrics & Gynecology

## 2015-05-04 VITALS — BP 121/80 | HR 84 | Wt 169.6 lb

## 2015-05-04 DIAGNOSIS — Z124 Encounter for screening for malignant neoplasm of cervix: Secondary | ICD-10-CM

## 2015-05-04 DIAGNOSIS — Z3401 Encounter for supervision of normal first pregnancy, first trimester: Secondary | ICD-10-CM

## 2015-05-04 DIAGNOSIS — Z3490 Encounter for supervision of normal pregnancy, unspecified, unspecified trimester: Secondary | ICD-10-CM | POA: Insufficient documentation

## 2015-05-04 NOTE — Progress Notes (Signed)
Patient is having a headache every day when she first wake up in the morning for just a couple of hours.  She is working on increasing her fluids to see if this will help.

## 2015-05-04 NOTE — Progress Notes (Signed)
    Subjective:    Isabel Weber is a 29 y.o. G1P0 at [redacted]w[redacted]d ,dated by 7 week scan, being seen today for her first obstetrical visit.  Patient does intend to breast feed. Pregnancy history fully reviewed.  Patient reports no complaints.  Filed Vitals:   05/04/15 0936  BP: 121/80  Pulse: 84  Weight: 169 lb 9.6 oz (76.93 kg)    HISTORY: OB History  Gravida Para Term Preterm AB SAB TAB Ectopic Multiple Living  1             # Outcome Date GA Lbr Len/2nd Weight Sex Delivery Anes PTL Lv  1 Current              Past Medical History  Diagnosis Date  . Amenorrhea, secondary    Past Surgical History  Procedure Laterality Date  . No past surgeries     Family History  Problem Relation Age of Onset  . Hypertension Mother      Exam    Uterus:     Pelvic Exam:    Perineum: No Hemorrhoids, Normal Perineum   Vulva: normal   Vagina:  normal mucosa, normal discharge   Cervix: multiparous appearance, no bleeding following Pap and no cervical motion tenderness   Adnexa: normal adnexa and no mass, fullness, tenderness   Bony Pelvis: average  System: Breast:  normal appearance, no masses or tenderness   Skin: normal coloration and turgor, no rashes    Neurologic: oriented, normal, negative   Extremities: normal strength, tone, and muscle mass   HEENT PERRLA, extra ocular movement intact and sclera clear, anicteric   Mouth/Teeth mucous membranes moist, pharynx normal without lesions and dental hygiene good   Neck supple and no masses   Cardiovascular: regular rate and rhythm   Respiratory:  appears well, vitals normal, no respiratory distress, acyanotic, normal RR, ear and throat exam is normal, neck free of mass or lymphadenopathy, chest clear, no wheezing, crepitations, rhonchi, normal symmetric air entry   Abdomen: soft, non-tender; bowel sounds normal; no masses,  no organomegaly   Urinary: urethral meatus normal      Assessment:    Pregnancy: G1P0 Patient Active  Problem List   Diagnosis Date Noted  . Supervision of normal first pregnancy 05/04/2015        Plan:   Genetic Screening discussed: Will get InformaSeq XY (NIPS) at 11 weeks Prenatal labs reviewed, missing HIV, to be drawn in 2 weeks with the InformaSeq XY. Continue Prenatal vitamins. Problem list reviewed and updated. Ultrasound discussed; fetal survey: to be ordered later. Declined nuchal translucency scan. The nature of [facility] with multiple MDs and other Advanced Practice Providers was explained to patient; also emphasized that residents, students are part of our team. Follow up in 4 weeks. No other complaints or concerns.  Routine obstetric precautions reviewed.    Tereso Newcomer, MD 05/04/2015

## 2015-05-04 NOTE — Patient Instructions (Addendum)
First Trimester of Pregnancy The first trimester of pregnancy is from week 1 until the end of week 12 (months 1 through 3). A week after a sperm fertilizes an egg, the egg will implant on the wall of the uterus. This embryo will begin to develop into a baby. Genes from you and your partner are forming the baby. The female genes determine whether the baby is a boy or a girl. At 6-8 weeks, the eyes and face are formed, and the heartbeat can be seen on ultrasound. At the end of 12 weeks, all the baby's organs are formed.  Now that you are pregnant, you will want to do everything you can to have a healthy baby. Two of the most important things are to get good prenatal care and to follow your health care provider's instructions. Prenatal care is all the medical care you receive before the baby's birth. This care will help prevent, find, and treat any problems during the pregnancy and childbirth. BODY CHANGES Your body goes through many changes during pregnancy. The changes vary from woman to woman.   You may gain or lose a couple of pounds at first.  You may feel sick to your stomach (nauseous) and throw up (vomit). If the vomiting is uncontrollable, call your health care provider.  You may tire easily.  You may develop headaches that can be relieved by medicines approved by your health care provider.  You may urinate more often. Painful urination may mean you have a bladder infection.  You may develop heartburn as a result of your pregnancy.  You may develop constipation because certain hormones are causing the muscles that push waste through your intestines to slow down.  You may develop hemorrhoids or swollen, bulging veins (varicose veins).  Your breasts may begin to grow larger and become tender. Your nipples may stick out more, and the tissue that surrounds them (areola) may become darker.  Your gums may bleed and may be sensitive to brushing and flossing.  Dark spots or blotches  (chloasma, mask of pregnancy) may develop on your face. This will likely fade after the baby is born.  Your menstrual periods will stop.  You may have a loss of appetite.  You may develop cravings for certain kinds of food.  You may have changes in your emotions from day to day, such as being excited to be pregnant or being concerned that something may go wrong with the pregnancy and baby.  You may have more vivid and strange dreams.  You may have changes in your hair. These can include thickening of your hair, rapid growth, and changes in texture. Some women also have hair loss during or after pregnancy, or hair that feels dry or thin. Your hair will most likely return to normal after your baby is born. WHAT TO EXPECT AT YOUR PRENATAL VISITS During a routine prenatal visit:  You will be weighed to make sure you and the baby are growing normally.  Your blood pressure will be taken.  Your abdomen will be measured to track your baby's growth.  The fetal heartbeat will be listened to starting around week 10 or 12 of your pregnancy.  Test results from any previous visits will be discussed. Your health care provider may ask you:  How you are feeling.  If you are feeling the baby move.  If you have had any abnormal symptoms, such as leaking fluid, bleeding, severe headaches, or abdominal cramping.  If you have any questions. Other tests   that may be performed during your first trimester include:  Blood tests to find your blood type and to check for the presence of any previous infections. They will also be used to check for low iron levels (anemia) and Rh antibodies. Later in the pregnancy, blood tests for diabetes will be done along with other tests if problems develop.  Urine tests to check for infections, diabetes, or protein in the urine.  An ultrasound to confirm the proper growth and development of the baby.  An amniocentesis to check for possible genetic problems.  Fetal  screens for spina bifida and Down syndrome.  You may need other tests to make sure you and the baby are doing well. HOME CARE INSTRUCTIONS  Medicines  Follow your health care provider's instructions regarding medicine use. Specific medicines may be either safe or unsafe to take during pregnancy.  Take your prenatal vitamins as directed.  If you develop constipation, try taking a stool softener if your health care provider approves. Diet  Eat regular, well-balanced meals. Choose a variety of foods, such as meat or vegetable-based protein, fish, milk and low-fat dairy products, vegetables, fruits, and whole grain breads and cereals. Your health care provider will help you determine the amount of weight gain that is right for you.  Avoid raw meat and uncooked cheese. These carry germs that can cause birth defects in the baby.  Eating four or five small meals rather than three large meals a day may help relieve nausea and vomiting. If you start to feel nauseous, eating a few soda crackers can be helpful. Drinking liquids between meals instead of during meals also seems to help nausea and vomiting.  If you develop constipation, eat more high-fiber foods, such as fresh vegetables or fruit and whole grains. Drink enough fluids to keep your urine clear or pale yellow. Activity and Exercise  Exercise only as directed by your health care provider. Exercising will help you:  Control your weight.  Stay in shape.  Be prepared for labor and delivery.  Experiencing pain or cramping in the lower abdomen or low back is a good sign that you should stop exercising. Check with your health care provider before continuing normal exercises.  Try to avoid standing for long periods of time. Move your legs often if you must stand in one place for a long time.  Avoid heavy lifting.  Wear low-heeled shoes, and practice good posture.  You may continue to have sex unless your health care provider directs you  otherwise. Relief of Pain or Discomfort  Wear a good support bra for breast tenderness.   Take warm sitz baths to soothe any pain or discomfort caused by hemorrhoids. Use hemorrhoid cream if your health care provider approves.   Rest with your legs elevated if you have leg cramps or low back pain.  If you develop varicose veins in your legs, wear support hose. Elevate your feet for 15 minutes, 3-4 times a day. Limit salt in your diet. Prenatal Care  Schedule your prenatal visits by the twelfth week of pregnancy. They are usually scheduled monthly at first, then more often in the last 2 months before delivery.  Write down your questions. Take them to your prenatal visits.  Keep all your prenatal visits as directed by your health care provider. Safety  Wear your seat belt at all times when driving.  Make a list of emergency phone numbers, including numbers for family, friends, the hospital, and police and fire departments. General Tips    Ask your health care provider for a referral to a local prenatal education class. Begin classes no later than at the beginning of month 6 of your pregnancy.  Ask for help if you have counseling or nutritional needs during pregnancy. Your health care provider can offer advice or refer you to specialists for help with various needs.  Do not use hot tubs, steam rooms, or saunas.  Do not douche or use tampons or scented sanitary pads.  Do not cross your legs for long periods of time.  Avoid cat litter boxes and soil used by cats. These carry germs that can cause birth defects in the baby and possibly loss of the fetus by miscarriage or stillbirth.  Avoid all smoking, herbs, alcohol, and medicines not prescribed by your health care provider. Chemicals in these affect the formation and growth of the baby.  Schedule a dentist appointment. At home, brush your teeth with a soft toothbrush and be gentle when you floss. SEEK MEDICAL CARE IF:   You have  dizziness.  You have mild pelvic cramps, pelvic pressure, or nagging pain in the abdominal area.  You have persistent nausea, vomiting, or diarrhea.  You have a bad smelling vaginal discharge.  You have pain with urination.  You notice increased swelling in your face, hands, legs, or ankles. SEEK IMMEDIATE MEDICAL CARE IF:   You have a fever.  You are leaking fluid from your vagina.  You have spotting or bleeding from your vagina.  You have severe abdominal cramping or pain.  You have rapid weight gain or loss.  You vomit blood or material that looks like coffee grounds.  You are exposed to German measles and have never had them.  You are exposed to fifth disease or chickenpox.  You develop a severe headache.  You have shortness of breath.  You have any kind of trauma, such as from a fall or a car accident. Document Released: 10/30/2001 Document Revised: 03/22/2014 Document Reviewed: 09/15/2013 [employer] Patient Information 2015 [employer]. This information is not intended to replace advice given to you by your health care provider. Make sure you discuss any questions you have with your health care provider.  Breastfeeding Deciding to breastfeed is one of the best choices you can make for you and your baby. A change in hormones during pregnancy causes your breast tissue to grow and increases the number and size of your milk ducts. These hormones also allow proteins, sugars, and fats from your blood supply to make breast milk in your milk-producing glands. Hormones prevent breast milk from being released before your baby is born as well as prompt milk flow after birth. Once breastfeeding has begun, thoughts of your baby, as well as his or her sucking or crying, can stimulate the release of milk from your milk-producing glands.  BENEFITS OF BREASTFEEDING For Your Baby  Your first milk (colostrum) helps your baby's digestive system function better.   There are antibodies  in your milk that help your baby fight off infections.   Your baby has a lower incidence of asthma, allergies, and sudden infant death syndrome.   The nutrients in breast milk are better for your baby than infant formulas and are designed uniquely for your baby's needs.   Breast milk improves your baby's brain development.   Your baby is less likely to develop other conditions, such as childhood obesity, asthma, or type 2 diabetes mellitus.  For You   Breastfeeding helps to create a very special bond between   you and your baby.   Breastfeeding is convenient. Breast milk is always available at the correct temperature and costs nothing.   Breastfeeding helps to burn calories and helps you lose the weight gained during pregnancy.   Breastfeeding makes your uterus contract to its prepregnancy size faster and slows bleeding (lochia) after you give birth.   Breastfeeding helps to lower your risk of developing type 2 diabetes mellitus, osteoporosis, and breast or ovarian cancer later in life. SIGNS THAT YOUR BABY IS HUNGRY Early Signs of Hunger  Increased alertness or activity.  Stretching.  Movement of the head from side to side.  Movement of the head and opening of the mouth when the corner of the mouth or cheek is stroked (rooting).  Increased sucking sounds, smacking lips, cooing, sighing, or squeaking.  Hand-to-mouth movements.  Increased sucking of fingers or hands. Late Signs of Hunger  Fussing.  Intermittent crying. Extreme Signs of Hunger Signs of extreme hunger will require calming and consoling before your baby will be able to breastfeed successfully. Do not wait for the following signs of extreme hunger to occur before you initiate breastfeeding:   Restlessness.  A loud, strong cry.   Screaming. BREASTFEEDING BASICS Breastfeeding Initiation  Find a comfortable place to sit or lie down, with your neck and back well supported.  Place a pillow or  rolled up blanket under your baby to bring him or her to the level of your breast (if you are seated). Nursing pillows are specially designed to help support your arms and your baby while you breastfeed.  Make sure that your baby's abdomen is facing your abdomen.   Gently massage your breast. With your fingertips, massage from your chest wall toward your nipple in a circular motion. This encourages milk flow. You may need to continue this action during the feeding if your milk flows slowly.  Support your breast with 4 fingers underneath and your thumb above your nipple. Make sure your fingers are well away from your nipple and your baby's mouth.   Stroke your baby's lips gently with your finger or nipple.   When your baby's mouth is open wide enough, quickly bring your baby to your breast, placing your entire nipple and as much of the colored area around your nipple (areola) as possible into your baby's mouth.   More areola should be visible above your baby's upper lip than below the lower lip.   Your baby's tongue should be between his or her lower gum and your breast.   Ensure that your baby's mouth is correctly positioned around your nipple (latched). Your baby's lips should create a seal on your breast and be turned out (everted).  It is common for your baby to suck about 2-3 minutes in order to start the flow of breast milk. Latching Teaching your baby how to latch on to your breast properly is very important. An improper latch can cause nipple pain and decreased milk supply for you and poor weight gain in your baby. Also, if your baby is not latched onto your nipple properly, he or she may swallow some air during feeding. This can make your baby fussy. Burping your baby when you switch breasts during the feeding can help to get rid of the air. However, teaching your baby to latch on properly is still the best way to prevent fussiness from swallowing air while breastfeeding. Signs  that your baby has successfully latched on to your nipple:    Silent tugging or silent   sucking, without causing you pain.   Swallowing heard between every 3-4 sucks.    Muscle movement above and in front of his or her ears while sucking.  Signs that your baby has not successfully latched on to nipple:   Sucking sounds or smacking sounds from your baby while breastfeeding.  Nipple pain. If you think your baby has not latched on correctly, slip your finger into the corner of your baby's mouth to break the suction and place it between your baby's gums. Attempt breastfeeding initiation again. Signs of Successful Breastfeeding Signs from your baby:   A gradual decrease in the number of sucks or complete cessation of sucking.   Falling asleep.   Relaxation of his or her body.   Retention of a small amount of milk in his or her mouth.   Letting go of your breast by himself or herself. Signs from you:  Breasts that have increased in firmness, weight, and size 1-3 hours after feeding.   Breasts that are softer immediately after breastfeeding.  Increased milk volume, as well as a change in milk consistency and color by the fifth day of breastfeeding.   Nipples that are not sore, cracked, or bleeding. Signs That Your Baby is Getting Enough Milk  Wetting at least 3 diapers in a 24-hour period. The urine should be clear and pale yellow by age 5 days.  At least 3 stools in a 24-hour period by age 5 days. The stool should be soft and yellow.  At least 3 stools in a 24-hour period by age 7 days. The stool should be seedy and yellow.  No loss of weight greater than 10% of birth weight during the first 3 days of age.  Average weight gain of 4-7 ounces (113-198 g) per week after age 4 days.  Consistent daily weight gain by age 5 days, without weight loss after the age of 2 weeks. After a feeding, your baby may spit up a small amount. This is common. BREASTFEEDING FREQUENCY AND  DURATION Frequent feeding will help you make more milk and can prevent sore nipples and breast engorgement. Breastfeed when you feel the need to reduce the fullness of your breasts or when your baby shows signs of hunger. This is called "breastfeeding on demand." Avoid introducing a pacifier to your baby while you are working to establish breastfeeding (the first 4-6 weeks after your baby is born). After this time you may choose to use a pacifier. Research has shown that pacifier use during the first year of a baby's life decreases the risk of sudden infant death syndrome (SIDS). Allow your baby to feed on each breast as long as he or she wants. Breastfeed until your baby is finished feeding. When your baby unlatches or falls asleep while feeding from the first breast, offer the second breast. Because newborns are often sleepy in the first few weeks of life, you may need to awaken your baby to get him or her to feed. Breastfeeding times will vary from baby to baby. However, the following rules can serve as a guide to help you ensure that your baby is properly fed:  Newborns (babies 4 weeks of age or younger) may breastfeed every 1-3 hours.  Newborns should not go longer than 3 hours during the day or 5 hours during the night without breastfeeding.  You should breastfeed your baby a minimum of 8 times in a 24-hour period until you begin to introduce solid foods to your baby at around 6   months of age. BREAST MILK PUMPING Pumping and storing breast milk allows you to ensure that your baby is exclusively fed your breast milk, even at times when you are unable to breastfeed. This is especially important if you are going back to work while you are still breastfeeding or when you are not able to be present during feedings. Your lactation consultant can give you guidelines on how long it is safe to store breast milk.  A breast pump is a machine that allows you to pump milk from your breast into a sterile bottle.  The pumped breast milk can then be stored in a refrigerator or freezer. Some breast pumps are operated by hand, while others use electricity. Ask your lactation consultant which type will work best for you. Breast pumps can be purchased, but some hospitals and breastfeeding support groups lease breast pumps on a monthly basis. A lactation consultant can teach you how to hand express breast milk, if you prefer not to use a pump.  CARING FOR YOUR BREASTS WHILE YOU BREASTFEED Nipples can become dry, cracked, and sore while breastfeeding. The following recommendations can help keep your breasts moisturized and healthy:  Avoid using soap on your nipples.   Wear a supportive bra. Although not required, special nursing bras and tank tops are designed to allow access to your breasts for breastfeeding without taking off your entire bra or top. Avoid wearing underwire-style bras or extremely tight bras.  Air dry your nipples for 3-4minutes after each feeding.   Use only cotton bra pads to absorb leaked breast milk. Leaking of breast milk between feedings is normal.   Use lanolin on your nipples after breastfeeding. Lanolin helps to maintain your skin's normal moisture barrier. If you use pure lanolin, you do not need to wash it off before feeding your baby again. Pure lanolin is not toxic to your baby. You may also hand express a few drops of breast milk and gently massage that milk into your nipples and allow the milk to air dry. In the first few weeks after giving birth, some women experience extremely full breasts (engorgement). Engorgement can make your breasts feel heavy, warm, and tender to the touch. Engorgement peaks within 3-5 days after you give birth. The following recommendations can help ease engorgement:  Completely empty your breasts while breastfeeding or pumping. You may want to start by applying warm, moist heat (in the shower or with warm water-soaked hand towels) just before feeding or  pumping. This increases circulation and helps the milk flow. If your baby does not completely empty your breasts while breastfeeding, pump any extra milk after he or she is finished.  Wear a snug bra (nursing or regular) or tank top for 1-2 days to signal your body to slightly decrease milk production.  Apply ice packs to your breasts, unless this is too uncomfortable for you.  Make sure that your baby is latched on and positioned properly while breastfeeding. If engorgement persists after 48 hours of following these recommendations, contact your health care provider or a lactation consultant. OVERALL HEALTH CARE RECOMMENDATIONS WHILE BREASTFEEDING  Eat healthy foods. Alternate between meals and snacks, eating 3 of each per day. Because what you eat affects your breast milk, some of the foods may make your baby more irritable than usual. Avoid eating these foods if you are sure that they are negatively affecting your baby.  Drink milk, fruit juice, and water to satisfy your thirst (about 10 glasses a day).   Rest   often, relax, and continue to take your prenatal vitamins to prevent fatigue, stress, and anemia.  Continue breast self-awareness checks.  Avoid chewing and smoking tobacco.  Avoid alcohol and drug use. Some medicines that may be harmful to your baby can pass through breast milk. It is important to ask your health care provider before taking any medicine, including all over-the-counter and prescription medicine as well as vitamin and herbal supplements. It is possible to become pregnant while breastfeeding. If birth control is desired, ask your health care provider about options that will be safe for your baby. SEEK MEDICAL CARE IF:   You feel like you want to stop breastfeeding or have become frustrated with breastfeeding.  You have painful breasts or nipples.  Your nipples are cracked or bleeding.  Your breasts are red, tender, or warm.  You have a swollen area on either  breast.  You have a fever or chills.  You have nausea or vomiting.  You have drainage other than breast milk from your nipples.  Your breasts do not become full before feedings by the fifth day after you give birth.  You feel sad and depressed.  Your baby is too sleepy to eat well.  Your baby is having trouble sleeping.   Your baby is wetting less than 3 diapers in a 24-hour period.  Your baby has less than 3 stools in a 24-hour period.  Your baby's skin or the white part of his or her eyes becomes yellow.   Your baby is not gaining weight by 5 days of age. SEEK IMMEDIATE MEDICAL CARE IF:   Your baby is overly tired (lethargic) and does not want to wake up and feed.  Your baby develops an unexplained fever. Document Released: 11/05/2005 Document Revised: 11/10/2013 Document Reviewed: 04/29/2013 [employer] Patient Information 2015 [employer]. This information is not intended to replace advice given to you by your health care provider. Make sure you discuss any questions you have with your health care provider.  

## 2015-05-08 ENCOUNTER — Encounter: Payer: Self-pay | Admitting: Obstetrics & Gynecology

## 2015-05-08 DIAGNOSIS — R87810 Cervical high risk human papillomavirus (HPV) DNA test positive: Secondary | ICD-10-CM

## 2015-05-08 DIAGNOSIS — R87610 Atypical squamous cells of undetermined significance on cytologic smear of cervix (ASC-US): Secondary | ICD-10-CM | POA: Insufficient documentation

## 2015-05-08 LAB — PAP IG, CT-NG NAA, HPV HIGH-RISK: PAP Smear Comment: 0

## 2015-05-11 ENCOUNTER — Telehealth: Payer: Self-pay | Admitting: *Deleted

## 2015-05-11 NOTE — Telephone Encounter (Signed)
Pt called in stating she has worked 12 hours on her feet today and noticed some vaginal spotting when using the bathroom. Pt states she has had some abd cramping as well. No recent intercourse. Explained to pt to go to [facility] for eval if bleeding increases or if abd pain increases. Adv pt to rest and stay well hydrated and come as needed for eval. Pt expressed understanding.

## 2015-05-12 ENCOUNTER — Encounter: Payer: Self-pay | Admitting: Obstetrics and Gynecology

## 2015-05-18 ENCOUNTER — Other Ambulatory Visit (INDEPENDENT_AMBULATORY_CARE_PROVIDER_SITE_OTHER): Payer: BLUE CROSS/BLUE SHIELD | Admitting: *Deleted

## 2015-05-18 ENCOUNTER — Telehealth: Payer: Self-pay | Admitting: *Deleted

## 2015-05-18 DIAGNOSIS — Z3401 Encounter for supervision of normal first pregnancy, first trimester: Secondary | ICD-10-CM

## 2015-05-18 DIAGNOSIS — N898 Other specified noninflammatory disorders of vagina: Secondary | ICD-10-CM | POA: Diagnosis not present

## 2015-05-18 DIAGNOSIS — Z36 Encounter for antenatal screening of mother: Secondary | ICD-10-CM

## 2015-05-18 NOTE — Progress Notes (Signed)
Patient here today to have her HIV and InformaSeq drawn.

## 2015-05-18 NOTE — Addendum Note (Signed)
Addended by: Tandy GawHINTON, Zain Lankford C on: 05/18/2015 01:44 PM   Modules accepted: Orders

## 2015-05-18 NOTE — Telephone Encounter (Signed)
Patient came into the office today for labs.   I have called and spoke with [facility] on 3 different occasions and requested the kit for the In[employer] that our office needs to draw the labs on this patient.  The kit was suppose to be at our office last week and it has still not arrived.  I called [facility] again yesterday and requested the kit because I seen the patient was on the schedule today and they said they would have it delivered STAT last night by the courier and it was not in the office this morning when I arrived.  I called [facility] this morning and now they said since it wasn't delivered they will bring it out STAT.  Again the patient arrived for her appointment and the kit has not arrived.  I went out in the lobby and spoke to the patient and politely and professionally explained that the kit has not arrived and that [facility] was suppose to have it out here on several different occasions but unfortunately it is not here and that I would call her as soon as it arrived and we would get her back in the office at her convenience.  The patient did not say much but did not seem happy about the situation.   Our receptionist said that someone called back and it sounded like her and she wanted to know the nurses name and then hung up on our receptionist.   She called back a second time and wanted to know the office managers name and number and hung up a second time.   Patient has complained in the past about providers here in the office.

## 2015-05-19 LAB — HIV ANTIBODY (ROUTINE TESTING W REFLEX): HIV Screen 4th Generation wRfx: NONREACTIVE

## 2015-05-21 LAB — NUSWAB VAGINITIS PLUS (VG+)

## 2015-05-24 ENCOUNTER — Encounter (HOSPITAL_COMMUNITY): Payer: Self-pay | Admitting: Emergency Medicine

## 2015-05-24 ENCOUNTER — Emergency Department (HOSPITAL_COMMUNITY): Payer: BLUE CROSS/BLUE SHIELD

## 2015-05-24 ENCOUNTER — Emergency Department (HOSPITAL_COMMUNITY)
Admission: EM | Admit: 2015-05-24 | Discharge: 2015-05-24 | Disposition: A | Discharge status: Home / Self Care | Payer: BLUE CROSS/BLUE SHIELD | Attending: Emergency Medicine | Admitting: Emergency Medicine

## 2015-05-24 DIAGNOSIS — Z79899 Other long term (current) drug therapy: Secondary | ICD-10-CM | POA: Diagnosis not present

## 2015-05-24 DIAGNOSIS — Z87891 Personal history of nicotine dependence: Secondary | ICD-10-CM | POA: Insufficient documentation

## 2015-05-24 DIAGNOSIS — O209 Hemorrhage in early pregnancy, unspecified: Secondary | ICD-10-CM | POA: Diagnosis not present

## 2015-05-24 DIAGNOSIS — Z3A12 12 weeks gestation of pregnancy: Secondary | ICD-10-CM | POA: Diagnosis not present

## 2015-05-24 DIAGNOSIS — O4690 Antepartum hemorrhage, unspecified, unspecified trimester: Secondary | ICD-10-CM

## 2015-05-24 LAB — URINALYSIS, ROUTINE W REFLEX MICROSCOPIC
BILIRUBIN URINE: NEGATIVE
Glucose, UA: NEGATIVE mg/dL
KETONES UR: NEGATIVE mg/dL
Leukocytes, UA: NEGATIVE
Nitrite: NEGATIVE
PROTEIN: NEGATIVE mg/dL
Specific Gravity, Urine: 1.006 (ref 1.005–1.030)
Urobilinogen, UA: 0.2 mg/dL (ref 0.0–1.0)
pH: 6.5 (ref 5.0–8.0)

## 2015-05-24 LAB — BASIC METABOLIC PANEL
Anion gap: 8 (ref 5–15)
BUN: 11 mg/dL (ref 6–20)
CALCIUM: 8.6 mg/dL — AB (ref 8.9–10.3)
CO2: 20 mmol/L — AB (ref 22–32)
CREATININE: 0.61 mg/dL (ref 0.44–1.00)
Chloride: 106 mmol/L (ref 101–111)
GFR calc Af Amer: >60 mL/min (ref >60)
GFR calc non Af Amer: >60 mL/min (ref >60)
Glucose, Bld: 82 mg/dL (ref 65–99)
Potassium: 3.7 mmol/L (ref 3.5–5.1)
Sodium: 134 mmol/L — ABNORMAL LOW (ref 135–145)

## 2015-05-24 LAB — CBC WITH DIFFERENTIAL/PLATELET
BASOS ABS: 0 10*3/uL (ref 0.0–0.1)
Basophils Relative: 0 % (ref 0–1)
EOS ABS: 0.3 10*3/uL (ref 0.0–0.7)
Eosinophils Relative: 4 % (ref 0–5)
HEMATOCRIT: 29.2 % — AB (ref 36.0–46.0)
HEMOGLOBIN: 10.1 g/dL — AB (ref 12.0–15.0)
LYMPHS ABS: 2.4 10*3/uL (ref 0.7–4.0)
LYMPHS PCT: 32 % (ref 12–46)
MCH: 30.4 pg (ref 26.0–34.0)
MCHC: 34.6 g/dL (ref 30.0–36.0)
MCV: 88 fL (ref 78.0–100.0)
Monocytes Absolute: 0.5 10*3/uL (ref 0.1–1.0)
Monocytes Relative: 7 % (ref 3–12)
Neutro Abs: 4.2 10*3/uL (ref 1.7–7.7)
Neutrophils Relative %: 57 % (ref 43–77)
PLATELETS: 360 10*3/uL (ref 150–400)
RBC: 3.32 MIL/uL — ABNORMAL LOW (ref 3.87–5.11)
RDW: 12.9 % (ref 11.5–15.5)
WBC: 7.5 10*3/uL (ref 4.0–10.5)

## 2015-05-24 LAB — URINE MICROSCOPIC-ADD ON

## 2015-05-24 LAB — HCG, QUANTITATIVE, PREGNANCY: hCG, Beta Chain, Quant, S: 80347 m[IU]/mL — ABNORMAL HIGH (ref <5)

## 2015-05-24 LAB — PREGNANCY, URINE: Preg Test, Ur: POSITIVE — AB

## 2015-05-24 NOTE — ED Notes (Signed)
NP at the bedside

## 2015-05-24 NOTE — Discharge Instructions (Signed)
You need to call your ob for follow up as dicussed Vaginal Bleeding During Pregnancy, First Trimester A small amount of bleeding (spotting) from the vagina is common in early pregnancy. Sometimes the bleeding is normal and is not a problem, and sometimes it is a sign of something serious. Be sure to tell your doctor about any bleeding from your vagina right away. HOME CARE  Watch your condition for any changes.  Follow your doctor's instructions about how active you can be.  If you are on bed rest:  You may need to stay in bed and only get up to use the bathroom.  You may be allowed to do some activities.  If you need help, make plans for someone to help you.  Write down:  The number of pads you use each day.  How often you change pads.  How soaked (saturated) your pads are.  Do not use tampons.  Do not douche.  Do not have sex or orgasms until your doctor says it is okay.  If you pass any tissue from your vagina, save the tissue so you can show it to your doctor.  Only take medicines as told by your doctor.  Do not take aspirin because it can make you bleed.  Keep all follow-up visits as told by your doctor. GET HELP IF:   You bleed from your vagina.  You have cramps.  You have labor pains.  You have a fever that does not go away after you take medicine. GET HELP RIGHT AWAY IF:   You have very bad cramps in your back or belly (abdomen).  You pass large clots or tissue from your vagina.  You bleed more.  You feel light-headed or weak.  You pass out (faint).  You have chills.  You are leaking fluid or have a gush of fluid from your vagina.  You pass out while pooping (having a bowel movement). MAKE SURE YOU:  Understand these instructions.  Will watch your condition.  Will get help right away if you are not doing well or get worse. Document Released: 03/22/2014 Document Reviewed: 07/13/2013 [facility] Patient Information 2015 [place], [place]. This  information is not intended to replace advice given to you by your health care provider. Make sure you discuss any questions you have with your health care provider.

## 2015-05-24 NOTE — ED Notes (Addendum)
Pt is [redacted] weeks pregnant; having scant dark brown spotting when wipes and scant amount in underwear. Was doing some 20 pound lifting and squatting. Pt reports she has gone for 9 week US and HR was in 150's. [facility]. Pt reports mild cramping. Pt states she is O positive blood type. Hx of one miscarriage.

## 2015-05-24 NOTE — ED Notes (Signed)
Patient returned from US.

## 2015-05-24 NOTE — ED Provider Notes (Signed)
CSN: [id number]     Arrival date & time 05/24/15  0929 History   First MD Initiated Contact with Patient 05/24/15 [phone]     Chief Complaint  Patient presents with  . Vaginal Bleeding  . Threatened Miscarriage     (Consider location/radiation/quality/duration/timing/severity/associated sxs/prior Treatment) HPI Comments: Pt comes in with c/o bleeding and she is [redacted] weeks pregnant. She states that it started this morning with urinating. This is her first pregnancy. No abdominal pain. She has been being seen by [facility]. She had bleeding earlier in the pregnancy but not this much.  The history is provided by the patient. A language interpreter was used.    History reviewed. No pertinent past medical history. Past Surgical History  Procedure Laterality Date  . No past surgeries     Family History  Problem Relation Age of Onset  . Hypertension Mother    History  Substance Use Topics  . Smoking status: Former Smoker -- 0.50 packs/day for 5 years    Types: Cigarettes  . Smokeless tobacco: Never Used  . Alcohol Use: No   OB History    Gravida Para Term Preterm AB TAB SAB Ectopic Multiple Living   1              Review of Systems  All other systems reviewed and are negative.     Allergies  Review of patient's allergies indicates no known allergies.  Home Medications   Prior to Admission medications   Medication Sig Start Date End Date Taking? Authorizing Provider  acetaminophen (TYLENOL) 500 MG tablet Take 1,000 mg by mouth every 6 (six) hours as needed for mild pain or moderate pain.   Yes Historical Provider, MD  Prenatal Vit-Fe Fumarate-FA (PRENATAL MULTIVITAMIN) TABS tablet Take 1 tablet by mouth daily at 12 noon.   Yes Historical Provider, MD  ondansetron (ZOFRAN) 4 MG tablet Take 1 tablet (4 mg total) by mouth daily as needed for nausea or vomiting. Patient not taking: Reported on 05/04/2015 03/28/15 03/27/16  Emily Filbert, MD   BP 122/72 mmHg  Pulse 87   Temp(Src) 99 F (37.2 C) (Oral)  Resp 18  SpO2 100%  LMP 02/21/2015 (LMP Unknown) Physical Exam  Constitutional: She is oriented to person, place, and time. She appears well-developed and well-nourished.  Cardiovascular: Normal rate and regular rhythm.   Pulmonary/Chest: Effort normal and breath sounds normal.  Abdominal: Soft. Bowel sounds are normal. There is no tenderness.  Genitourinary:  Small about of brown blood noted in vaginal vault. Os is closed  Musculoskeletal: Normal range of motion.  Neurological: She is alert and oriented to person, place, and time. She exhibits normal muscle tone. Coordination normal.  Skin: Skin is warm and dry.  Nursing note and vitals reviewed.   ED Course  Procedures (including critical care time) Labs Review Labs Reviewed  PREGNANCY, URINE - Abnormal; Notable for the following:    Preg Test, Ur POSITIVE (*)    All other components within normal limits  URINALYSIS, ROUTINE W REFLEX MICROSCOPIC (NOT AT [facility]) - Abnormal; Notable for the following:    Hgb urine dipstick MODERATE (*)    All other components within normal limits  CBC WITH DIFFERENTIAL/PLATELET - Abnormal; Notable for the following:    RBC 3.32 (*)    Hemoglobin 10.1 (*)    HCT 29.2 (*)    All other components within normal limits  BASIC METABOLIC PANEL - Abnormal; Notable for the following:    Sodium 134 (*)  CO2 20 (*)    Calcium 8.6 (*)    All other components within normal limits  HCG, QUANTITATIVE, PREGNANCY - Abnormal; Notable for the following:    hCG, Beta Chain, [place] [id number] (*)    All other components within normal limits  URINE MICROSCOPIC-ADD ON - Abnormal; Notable for the following:    Squamous Epithelial / LPF FEW (*)    All other components within normal limits    Imaging Review US Ob Comp Less 14 Wks  05/24/2015   CLINICAL DATA:  Vaginal bleeding  EXAM: OBSTETRIC <14 WK [place] AND TRANSVAGINAL OB US  TECHNIQUE: Both transabdominal and transvaginal  ultrasound examinations were performed for complete evaluation of the gestation as well as the maternal uterus, adnexal regions, and pelvic cul-de-sac. Transvaginal technique was performed to assess early pregnancy.  COMPARISON:  April 24, 2015  FINDINGS: Intrauterine gestational sac: Visualized/normal in shape.  Yolk sac:  Not visualized  Embryo:  Visualized  Cardiac Activity: Visualized  Heart Rate: 136  bpm  CRL:  60  mm   12 w   3 d                  [place] EDC: December 03, 2015  Maternal uterus/adnexae: There is no demonstrable subchorionic hemorrhage. Cervical os is closed. Amniotic fluid volume appears normal for gestational age. Placenta is seen posterior without abruption or previa. Fetal position is variable. There is a right ovarian cyst measuring 3.4 x 2.3 x 2.8 cm. There are no other extrauterine masses. No free pelvic fluid.  IMPRESSION: Single live intrauterine gestation with estimated gestational age of approximately 12 weeks based on current measurements and prior ultrasound. Benign appearing right ovarian cyst. Cervical os appears closed. No subchorionic hemorrhage.   Electronically Signed   By: Bretta Bang III M.D.   On: 05/24/2015 12:13   US Ob Transvaginal  05/24/2015   CLINICAL DATA:  Vaginal bleeding  EXAM: OBSTETRIC <14 WK [place] AND TRANSVAGINAL OB US  TECHNIQUE: Both transabdominal and transvaginal ultrasound examinations were performed for complete evaluation of the gestation as well as the maternal uterus, adnexal regions, and pelvic cul-de-sac. Transvaginal technique was performed to assess early pregnancy.  COMPARISON:  April 24, 2015  FINDINGS: Intrauterine gestational sac: Visualized/normal in shape.  Yolk sac:  Not visualized  Embryo:  Visualized  Cardiac Activity: Visualized  Heart Rate: 136  bpm  CRL:  60  mm   12 w   3 d                  [place] EDC: December 03, 2015  Maternal uterus/adnexae: There is no demonstrable subchorionic hemorrhage. Cervical os is closed. Amniotic fluid volume  appears normal for gestational age. Placenta is seen posterior without abruption or previa. Fetal position is variable. There is a right ovarian cyst measuring 3.4 x 2.3 x 2.8 cm. There are no other extrauterine masses. No free pelvic fluid.  IMPRESSION: Single live intrauterine gestation with estimated gestational age of approximately 12 weeks based on current measurements and prior ultrasound. Benign appearing right ovarian cyst. Cervical os appears closed. No subchorionic hemorrhage.   Electronically Signed   By: Bretta Bang III M.D.   On: 05/24/2015 12:13     EKG Interpretation None      MDM   Final diagnoses:  Vaginal bleeding in pregnancy, unspecified trimester    Pt is o pos so no need for rhogam. Discussed findings with pt including that can't tell that she won't  miscarry. Discussed follow up with ob    Teressa LowerVrinda Aprill Banko, NP 05/24/15 1222  Benjiman CoreNathan Suzzane Quilter, MD 05/24/15 1630

## 2015-05-24 NOTE — ED Notes (Signed)
Patient transported to Ultrasound without distress 

## 2015-05-25 LAB — INFORMASEQ(SM) WITH XY ANALYSIS
FETAL FRACTION (%): 8.1
FETAL NUMBER: 1
GESTATIONAL AGE AT COLLECTION: 11 wk
Weight: 169 [lb_av]

## 2015-06-01 ENCOUNTER — Encounter: Payer: Self-pay | Admitting: Obstetrics and Gynecology

## 2015-06-01 ENCOUNTER — Ambulatory Visit (INDEPENDENT_AMBULATORY_CARE_PROVIDER_SITE_OTHER): Payer: BLUE CROSS/BLUE SHIELD | Admitting: Obstetrics and Gynecology

## 2015-06-01 VITALS — BP 115/75 | HR 80 | Wt 176.0 lb

## 2015-06-01 DIAGNOSIS — Z3402 Encounter for supervision of normal first pregnancy, second trimester: Secondary | ICD-10-CM

## 2015-06-01 DIAGNOSIS — R87610 Atypical squamous cells of undetermined significance on cytologic smear of cervix (ASC-US): Secondary | ICD-10-CM

## 2015-06-01 DIAGNOSIS — R87810 Cervical high risk human papillomavirus (HPV) DNA test positive: Secondary | ICD-10-CM

## 2015-06-01 NOTE — Progress Notes (Signed)
Subjective:  Isabel SaxonJessica N Weber is a 29 y.o. G1P0 at 9663w0d being seen today for ongoing prenatal care.  Patient reports some vaginal bleeding a week ago which has since resolved.  Contractions: Not present.  Vag. Bleeding: None. Movement: Absent. Denies leaking of fluid.   The following portions of the patient's history were reviewed and updated as appropriate: allergies, current medications, past family history, past medical history, past social history, past surgical history and problem list.   Objective:   Filed Vitals:   06/01/15 0818  BP: 115/75  Pulse: 80  Weight: 176 lb (79.833 kg)    Fetal Status:     Movement: Absent     General:  Alert, oriented and cooperative. Patient is in no acute distress.  Skin: Skin is warm and dry. No rash noted.   Cardiovascular: Normal heart rate noted  Respiratory: Normal respiratory effort, no problems with respiration noted  Abdomen: Soft, gravid, appropriate for gestational age. Pain/Pressure: Absent     Vaginal: Vag. Bleeding: None.    Vag D/C Character: Thin  Cervix: Not evaluated        Extremities: Normal range of motion.  Edema: None  Mental Status: Normal mood and affect. Normal behavior. Normal judgment and thought content.   Urinalysis:      Assessment and Plan:  Pregnancy: G1P0 at 763w0d  1. 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 Encounter for supervision of normal first pregnancy in second trimester Patient is doing well  Continue monitoring vaginal bleeding Anatomy ultrasound to be scheduled at her next visit   General obstetric precautions including but not limited to vaginal bleeding, contractions, leaking of fluid and fetal movement were reviewed in detail with the patient.  Please refer to After Visit Summary for other counseling recommendations.   Return in about 4 weeks (around 06/29/2015).   Catalina AntiguaPeggy Foday Cone, MD

## 2015-06-09 ENCOUNTER — Telehealth: Payer: Self-pay | Admitting: *Deleted

## 2015-06-09 NOTE — Telephone Encounter (Signed)
Need to offer BabyRx at next visit

## 2015-06-29 ENCOUNTER — Ambulatory Visit (INDEPENDENT_AMBULATORY_CARE_PROVIDER_SITE_OTHER): Payer: BLUE CROSS/BLUE SHIELD | Admitting: Obstetrics & Gynecology

## 2015-06-29 VITALS — BP 114/74 | HR 90 | Wt 185.4 lb

## 2015-06-29 DIAGNOSIS — N760 Acute vaginitis: Secondary | ICD-10-CM | POA: Diagnosis not present

## 2015-06-29 DIAGNOSIS — O26892 Other specified pregnancy related conditions, second trimester: Secondary | ICD-10-CM

## 2015-06-29 DIAGNOSIS — A499 Bacterial infection, unspecified: Secondary | ICD-10-CM | POA: Diagnosis not present

## 2015-06-29 DIAGNOSIS — R87810 Cervical high risk human papillomavirus (HPV) DNA test positive: Secondary | ICD-10-CM

## 2015-06-29 DIAGNOSIS — R87610 Atypical squamous cells of undetermined significance on cytologic smear of cervix (ASC-US): Secondary | ICD-10-CM

## 2015-06-29 DIAGNOSIS — Z3402 Encounter for supervision of normal first pregnancy, second trimester: Secondary | ICD-10-CM

## 2015-06-29 DIAGNOSIS — N898 Other specified noninflammatory disorders of vagina: Secondary | ICD-10-CM

## 2015-06-29 DIAGNOSIS — B9689 Other specified bacterial agents as the cause of diseases classified elsewhere: Secondary | ICD-10-CM

## 2015-06-29 NOTE — Progress Notes (Signed)
Subjective:  Isabel Weber is a 29 y.o. G1P0 at [redacted]w[redacted]d being seen today for ongoing prenatal care.  Patient reports vaginal irritation and abnormal vaginal discharge; has been treating with Monistat 7.  Contractions: Not present.  Vag. Bleeding: None. Movement: Present. Denies leaking of fluid.   The following portions of the patient's history were reviewed and updated as appropriate: allergies, current medications, past family history, past medical history, past social history, past surgical history and problem list.   Objective:   Filed Vitals:   06/29/15 0834  BP: 114/74  Pulse: 90  Weight: 185 lb 6 oz (84.086 kg)    Fetal Status: Fetal Heart Rate (bpm): 159   Movement: Present     General:  Alert, oriented and cooperative. Patient is in no acute distress.  Skin: Skin is warm and dry. No rash noted.   Cardiovascular: Normal heart rate noted  Respiratory: Normal respiratory effort, no problems with respiration noted  Abdomen: Soft, gravid, appropriate for gestational age. Pain/Pressure: Present     Pelvic: Vag. Bleeding: None Vag D/C Character: Thick  White discharge seen, no odor perceived. Cervix visually closed.        Extremities: Normal range of motion.  Edema: None  Mental Status: Normal mood and affect. Normal behavior. Normal judgment and thought content.   Urinalysis: Urine Protein: Trace Urine Glucose: Negative  Assessment and Plan:  Pregnancy: G1P0 at [redacted]w[redacted]d  1. Encounter for supervision of normal first pregnancy in second trimester Normal NIPS. Anatomy scan ordered - US MFM OF COMP + 14 WK; Future  2. 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 Discussed role of HPV in cervical dysplasia, need for surveillance  3. Vaginal discharge during pregnancy in second trimester Will follow up wet prep results and manage accordingly  Routine obstetric precautions reviewed. Please refer to After Visit Summary for other counseling  recommendations.  Return in about 4 weeks (around 07/27/2015) for OB visit and colposcopy.   Tereso Newcomer, MD

## 2015-06-29 NOTE — Patient Instructions (Signed)
Return to clinic for any scheduled appointments or for any gynecologic concerns as needed.   Human Papillomavirus Human papillomavirus (HPV) is the most common sexually transmitted infection (STI) and is highly contagious. HPV infections cause genital warts and cancers to the outlet of the womb (cervix), birth canal (vagina), opening of the birth canal (vulva), and anus. There are over 100 types of HPV. Four types of HPV are responsible for causing 70% of all cervical cancers. Ninety percent of anal cancers and genital warts are caused by HPV. Unless you have wart-like lesions in the throat or genital warts that you can see or feel, HPV usually does not cause symptoms. Therefore, people can be infected for long periods and pass it on to others without knowing it. HPV in pregnancy usually does not cause a problem for the mother or baby. If the mother has genital warts, the baby rarely gets infected. When the HPV infection is found to be pre-cancerous on the cervix, vagina, or vulva, the mother will be followed closely during the pregnancy. Any needed treatment will be done after the baby is born. CAUSES   Having unprotected sex. HPV can be spread by oral, vaginal, or anal sexual activity.  Having several sex partners.  Having a sex partner who has other sex partners.  Having or having had another sexually transmitted infection. SYMPTOMS   More than 90% of people carrying HPV cannot tell anything is wrong.  Wart-like lesions in the throat (from having oral sex).  Warts in the infected skin or mucous membranes.  Genital warts may itch, burn, or bleed.  Genital warts may be painful or bleed during sexual intercourse. DIAGNOSIS   Genital warts are easily seen with the naked eye.  Currently, there is no FDA-approved test to detect HPV in males.  In females, a Pap test can show cells which are infected with HPV.  In females, a scope can be used to view the cervix (colposcopy). A colposcopy  can be performed if the pelvic exam or Pap test is abnormal.  In females, a sample of tissue may be removed (biopsy) during the colposcopy. TREATMENT   Treatment of genital warts can include:  Podophyllin lotion or gel.  Bichloroacetic acid (BCA) or trichloroacetic acid (TCA).  Podofilox solution or gel.  Imiquimod cream.  Interferon injections.  Use of a probe to apply extreme cold (cryotherapy).  Application of an intense beam of light (laser treatment).  Use of a probe to apply extreme heat (electrocautery).  Surgery.  HPV of the cervix, vagina, or vulva can be treated with:  Cryotherapy.  Laser treatment.  Electrocautery.  Surgery. Your caregiver will follow you closely after you are treated. This is because the HPV can come back and may need treatment again. HOME CARE INSTRUCTIONS   Follow your caregiver's instructions regarding medications, Pap tests, and follow-up exams.  Do not touch or scratch the warts.  Do not treat genital warts with medication used for treating hand warts.  Tell your sex partner about your infection because he or she may also need treatment.  Do not have sex while you are being treated.  After treatment, use condoms during sex to prevent future infections.  Have only 1 sex partner.  Have a sex partner who does not have other sex partners.  Use over-the-counter creams for itching or irritation as directed by your caregiver.  Use over-the-counter or prescription medicines for pain, discomfort, or fever as directed by your caregiver.  Do not douche or use  tampons during treatment of HPV. PREVENTION   Talk to your caregiver about getting the HPV vaccines. These vaccines prevent some HPV infections and cancers. It is recommended that the vaccine be given to males and females between the age of 42 and 52 years old. It will not work if you already have HPV and it is not recommended for pregnant women. The vaccines are not recommended  for pregnant women.  Call your caregiver if you think you are pregnant and have the HPV.  A PAP test is done to screen for cervical cancer.  The first PAP test should be done at age 68.  Between ages 63 and 58, PAP tests are repeated every 2 years.  Beginning at age 34, you are advised to have a PAP test every 3 years as long as your past 3 PAP tests have been normal.  Some women have medical problems that increase the chance of getting cervical cancer. Talk to your caregiver about these problems. It is especially important to talk to your caregiver if a new problem develops soon after your last PAP test. In these cases, your caregiver may recommend more frequent screening and Pap tests.  The above recommendations are the same for women who have or have not gotten the vaccine for HPV (Human Papillomavirus).  If you had a hysterectomy for a problem that was not a cancer or a condition that could lead to cancer, then you no longer need Pap tests. However, even if you no longer need a PAP test, a regular exam is a good idea to make sure no other problems are starting.   If you are between ages 83 and 41, and you have had normal Pap tests going back 10 years, you no longer need Pap tests. However, even if you no longer need a PAP test, a regular exam is a good idea to make sure no other problems are starting.  If you have had past treatment for cervical cancer or a condition that could lead to cancer, you need Pap tests and screening for cancer for at least 20 years after your treatment.  If Pap tests have been discontinued, risk factors (such as a new sexual partner)need to be re-assessed to determine if screening should be resumed.  Some women may need screenings more often if they are at high risk for cervical cancer. SEEK MEDICAL CARE IF:   The treated skin becomes red, swollen or painful.  You have an oral temperature above 102 F (38.9 C).  You feel generally ill.  You feel  lumps or pimple-like projections in and around your genital area.  You develop bleeding of the vagina or the treatment area.  You develop painful sexual intercourse. Document Released: 01/26/2004 Document Revised: 01/28/2012 Document Reviewed: 02/10/2014 [facility] Patient Information 2015 [place], [place]. This information is not intended to replace advice given to you by your health care provider. Make sure you discuss any questions you have with your health care provider.

## 2015-06-29 NOTE — Progress Notes (Signed)
C/O occasional pressure in her lower abdomen and vaginal itching with a thick white discharge

## 2015-06-30 LAB — WET PREP, GENITAL
TRICH WET PREP: NONE SEEN
WBC, Wet Prep HPF POC: NONE SEEN
Yeast Wet Prep HPF POC: NONE SEEN

## 2015-06-30 MED ORDER — METRONIDAZOLE 500 MG PO TABS: 500.0000 mg | ORAL_TABLET | Freq: Two times a day (BID) | ORAL | Status: DC

## 2015-06-30 NOTE — Addendum Note (Signed)
Addended by: Jaynie Collins A on: 06/30/2015 10:44 AM   Modules accepted: Orders

## 2015-07-13 ENCOUNTER — Ambulatory Visit (HOSPITAL_COMMUNITY)
Admission: RE | Admit: 2015-07-13 | Discharge: 2015-07-13 | Disposition: A | Discharge status: Home / Self Care | Payer: BLUE CROSS/BLUE SHIELD | Source: Ambulatory Visit | Attending: Obstetrics & Gynecology | Admitting: Obstetrics & Gynecology

## 2015-07-13 ENCOUNTER — Other Ambulatory Visit: Payer: Self-pay | Admitting: Obstetrics & Gynecology

## 2015-07-13 DIAGNOSIS — Z3A20 20 weeks gestation of pregnancy: Secondary | ICD-10-CM | POA: Insufficient documentation

## 2015-07-13 DIAGNOSIS — Z3689 Encounter for other specified antenatal screening: Secondary | ICD-10-CM

## 2015-07-13 DIAGNOSIS — Z3402 Encounter for supervision of normal first pregnancy, second trimester: Secondary | ICD-10-CM | POA: Diagnosis not present

## 2015-07-18 ENCOUNTER — Telehealth: Payer: Self-pay | Admitting: *Deleted

## 2015-07-18 DIAGNOSIS — Z3402 Encounter for supervision of normal first pregnancy, second trimester: Secondary | ICD-10-CM

## 2015-07-18 NOTE — Telephone Encounter (Signed)
Informed pt of [place] result and that they were unable to visualize some anatomy so she would need a follow-up in 4 wks for rescan.  Will schedule and call pt with appt.

## 2015-07-18 NOTE — Telephone Encounter (Signed)
-----   Message from Pennie Banter sent at 07/18/2015  2:05 PM EDT ----- Contact: [phone] Calling to get her ultrasound results.

## 2015-07-18 NOTE — Telephone Encounter (Signed)
Pt requested result of [place], due to inability to visualize certain anatomy MFM recommended follow-up in 4 wks, pt informed.  Sent order for follow-up.

## 2015-07-27 ENCOUNTER — Ambulatory Visit (INDEPENDENT_AMBULATORY_CARE_PROVIDER_SITE_OTHER): Payer: BLUE CROSS/BLUE SHIELD | Admitting: Obstetrics & Gynecology

## 2015-07-27 VITALS — BP 104/73 | HR 98

## 2015-07-27 DIAGNOSIS — R87610 Atypical squamous cells of undetermined significance on cytologic smear of cervix (ASC-US): Secondary | ICD-10-CM

## 2015-07-27 DIAGNOSIS — R87810 Cervical high risk human papillomavirus (HPV) DNA test positive: Secondary | ICD-10-CM

## 2015-07-27 DIAGNOSIS — Z23 Encounter for immunization: Secondary | ICD-10-CM | POA: Diagnosis not present

## 2015-07-27 DIAGNOSIS — Z3402 Encounter for supervision of normal first pregnancy, second trimester: Secondary | ICD-10-CM

## 2015-07-27 NOTE — Progress Notes (Signed)
Subjective:  Isabel Weber is a 29 y.o. G1P0 at [redacted]w[redacted]d being seen today for ongoing prenatal care and colposcopy for recent ASCUS +HRHPV pap.  Patient reports no complaints.  Contractions: Not present.  Vag. Bleeding: None. Movement: Present. Denies leaking of fluid.   The following portions of the patient's history were reviewed and updated as appropriate: allergies, current medications, past family history, past medical history, past social history, past surgical history and problem list.   Objective:   Filed Vitals:   07/27/15 0903  BP: 104/73  Pulse: 98    Fetal Status: Fetal Heart Rate (bpm): 155 Fundal Height: 21 cm Movement: Present     General:  Alert, oriented and cooperative. Patient is in no acute distress.  Skin: Skin is warm and dry. No rash noted.   Cardiovascular: Normal heart rate noted  Respiratory: Normal respiratory effort, no problems with respiration noted  Abdomen: Soft, gravid, appropriate for gestational age. Pain/Pressure: Absent     Pelvic: Vag. Bleeding: None Vag D/C Character: Thin   Colposcopy done, see note below       Closed cervix visualized  Extremities: Normal range of motion.  Edema: None  Mental Status: Normal mood and affect. Normal behavior. Normal judgment and thought content.   Urinalysis: Urine Protein: Negative Urine Glucose: Negative   COLPOSCOPY PROCEDURE NOTE Patient given informed consent, signed copy in the chart, time out was performed.  Placed in lithotomy position. Cervix viewed with speculum and colposcope after application of acetic acid.  Colposcopy adequate? Yes Acetowhite lesion(s) noted at 1 o'clock; no evidence of high grade changes, at most would be CIN I. No biopsy or ECC obtained. Patient was given post procedure instructions.  Will repeat cotesting in one year.   Assessment and Plan:  Pregnancy: G1P0 at [redacted]w[redacted]d  1. 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 Encounter for supervision of normal first pregnancy in second trimester - Flu Vaccine QUAD 36+ mos IM (Fluarix, Quad PF) given today Follow up anatomy scan scheduled given inadequate initial scan Preterm labor symptoms and general obstetric precautions including but not limited to vaginal bleeding, contractions, leaking of fluid and fetal movement were reviewed in detail with the patient. Please refer to After Visit Summary for other counseling recommendations.  Return in about 4 weeks (around 08/24/2015) for OB Visit.   Tereso Newcomer, MD

## 2015-07-27 NOTE — Patient Instructions (Signed)
Return to clinic for any obstetric concerns or go to [facility] for evaluation  

## 2015-08-17 ENCOUNTER — Ambulatory Visit (HOSPITAL_COMMUNITY)
Admission: RE | Admit: 2015-08-17 | Discharge: 2015-08-17 | Disposition: A | Discharge status: Home / Self Care | Payer: BLUE CROSS/BLUE SHIELD | Source: Ambulatory Visit | Attending: Obstetrics & Gynecology | Admitting: Obstetrics & Gynecology

## 2015-08-17 DIAGNOSIS — O283 Abnormal ultrasonic finding on antenatal screening of mother: Secondary | ICD-10-CM | POA: Diagnosis not present

## 2015-08-17 DIAGNOSIS — O409XX0 Polyhydramnios, unspecified trimester, not applicable or unspecified: Secondary | ICD-10-CM | POA: Insufficient documentation

## 2015-08-17 DIAGNOSIS — Z3402 Encounter for supervision of normal first pregnancy, second trimester: Secondary | ICD-10-CM

## 2015-08-17 DIAGNOSIS — O289 Unspecified abnormal findings on antenatal screening of mother: Secondary | ICD-10-CM

## 2015-08-17 DIAGNOSIS — O402XX1 Polyhydramnios, second trimester, fetus 1: Secondary | ICD-10-CM

## 2015-08-24 ENCOUNTER — Ambulatory Visit (INDEPENDENT_AMBULATORY_CARE_PROVIDER_SITE_OTHER): Payer: BLUE CROSS/BLUE SHIELD | Admitting: Obstetrics & Gynecology

## 2015-08-24 VITALS — BP 128/81 | HR 118 | Wt 203.0 lb

## 2015-08-24 DIAGNOSIS — Z3402 Encounter for supervision of normal first pregnancy, second trimester: Secondary | ICD-10-CM

## 2015-08-24 DIAGNOSIS — O402XX0 Polyhydramnios, second trimester, not applicable or unspecified: Secondary | ICD-10-CM

## 2015-08-24 NOTE — Patient Instructions (Signed)
Return to clinic for any obstetric concerns or go to [facility] for evaluation  

## 2015-08-24 NOTE — Progress Notes (Signed)
Subjective:  Isabel Weber is a 29 y.o. G1P0 at [redacted]w[redacted]d being seen today for ongoing prenatal care.  Patient reports occasional BLE edema, sometimes L>R.  No edema currently.  Contractions: Not present.  Vag. Bleeding: None. Movement: Present. Denies leaking of fluid.   The following portions of the patient's history were reviewed and updated as appropriate: allergies, current medications, past family history, past medical history, past social history, past surgical history and problem list.   Objective:   Filed Vitals:   08/24/15 0859  BP: 128/81  Pulse: 118  Weight: 203 lb (92.08 kg)    Fetal Status: Fetal Heart Rate (bpm): 160 Fundal Height: 30 cm Movement: Present     General:  Alert, oriented and cooperative. Patient is in no acute distress.  Skin: Skin is warm and dry. No rash noted.   Cardiovascular: Normal heart rate noted  Respiratory: Normal respiratory effort, no problems with respiration noted  Abdomen: Soft, gravid, appropriate for gestational age. Pain/Pressure: Absent     Pelvic: Vag. Bleeding: None Vag D/C Character: Thin   Cervical exam deferred        Extremities: Normal range of motion.  Edema: Trace in BLE. No palpable cords, negative Homan's signs on BLE  Mental Status: Normal mood and affect. Normal behavior. Normal judgment and thought content.   Urinalysis: Urine Protein: Negative Urine Glucose: Negative  Assessment and Plan:  Pregnancy: G1P0 at [redacted]w[redacted]d  1. Polyhydramnios, antepartum, second trimester, not applicable or unspecified fetus Noted on [redacted]w[redacted]d scan, SDP 9.8 cm.  Normal fetal anatomy. Plan per [facility]FM is to rescan at 25 weeks; scheduled 08/26/15 If polyhydramnios persists, start antenatal testing at 28 weeks with weekly BPP  2. Encounter for supervision of normal first pregnancy in second trimester Patient told to call/come in for worsening leg edema given increased risk of VTE in pregnancy. No other risk factors for VTE. Preterm labor symptoms and  general obstetric precautions including but not limited to vaginal bleeding, contractions, leaking of fluid and fetal movement were reviewed in detail with the patient. Please refer to After Visit Summary for other counseling recommendations.  Return in about 3 weeks (around 09/14/2015) for 1 hr GTT, 3rd trimester labs, TDap, OB Visit.   Tereso Newcomer, MD

## 2015-08-26 ENCOUNTER — Encounter (HOSPITAL_COMMUNITY): Payer: Self-pay

## 2015-08-26 ENCOUNTER — Ambulatory Visit (HOSPITAL_COMMUNITY)
Admission: RE | Admit: 2015-08-26 | Discharge: 2015-08-26 | Disposition: A | Discharge status: Home / Self Care | Payer: BLUE CROSS/BLUE SHIELD | Source: Ambulatory Visit | Attending: Obstetrics & Gynecology | Admitting: Obstetrics & Gynecology

## 2015-08-26 VITALS — BP 115/48 | HR 101 | Wt 202.0 lb

## 2015-08-26 DIAGNOSIS — O289 Unspecified abnormal findings on antenatal screening of mother: Secondary | ICD-10-CM

## 2015-08-26 DIAGNOSIS — O283 Abnormal ultrasonic finding on antenatal screening of mother: Secondary | ICD-10-CM | POA: Insufficient documentation

## 2015-08-26 DIAGNOSIS — O402XX0 Polyhydramnios, second trimester, not applicable or unspecified: Secondary | ICD-10-CM | POA: Insufficient documentation

## 2015-08-26 DIAGNOSIS — O409XX0 Polyhydramnios, unspecified trimester, not applicable or unspecified: Secondary | ICD-10-CM

## 2015-08-29 ENCOUNTER — Telehealth: Payer: Self-pay | Admitting: *Deleted

## 2015-08-29 NOTE — Telephone Encounter (Signed)
Clarified with the pt the instructions on when to start the glucola drink prior to coming in for her appt to do the one hr GTT.  Pt acknowledged instructions.

## 2015-08-29 NOTE — Telephone Encounter (Signed)
Called pt, no answer, left message for pt to call office.  

## 2015-08-29 NOTE — Telephone Encounter (Signed)
-----   Message from Jacinda S Battle sent at 08/26/2015  9:57 AM EDT ----- Regarding: GTT questions Contact: [phone] Isabel Weber had an appt with us on 10/5, then a ultrasound today at [facility], they advised her to move up her appt where we scheduled GTT (she already has the drink)   Moved her appt to 10/13....  Would like clarification as to what she needs to do with the drink also what she needs to do as far as that appt. 

## 2015-08-29 NOTE — Telephone Encounter (Signed)
-----   Message from Olevia Bowens sent at 08/26/2015  9:57 AM EDT ----- Regarding: GTT questions Contact: [phone] Isabel Weber had an appt with [place] on 10/5, then a ultrasound today at [facility], they advised her to move up her appt where we scheduled GTT (she already has the drink)   Moved her appt to 10/13....  Would like clarification as to what she needs to do with the drink also what she needs to do as far as that appt.

## 2015-09-01 ENCOUNTER — Encounter: Payer: Self-pay | Admitting: Obstetrics and Gynecology

## 2015-09-01 ENCOUNTER — Ambulatory Visit (INDEPENDENT_AMBULATORY_CARE_PROVIDER_SITE_OTHER): Payer: BLUE CROSS/BLUE SHIELD | Admitting: Obstetrics and Gynecology

## 2015-09-01 VITALS — BP 135/76 | HR 105 | Wt 203.0 lb

## 2015-09-01 DIAGNOSIS — Z23 Encounter for immunization: Secondary | ICD-10-CM | POA: Diagnosis not present

## 2015-09-01 DIAGNOSIS — R87810 Cervical high risk human papillomavirus (HPV) DNA test positive: Secondary | ICD-10-CM

## 2015-09-01 DIAGNOSIS — R87610 Atypical squamous cells of undetermined significance on cytologic smear of cervix (ASC-US): Secondary | ICD-10-CM

## 2015-09-01 DIAGNOSIS — Z3402 Encounter for supervision of normal first pregnancy, second trimester: Secondary | ICD-10-CM

## 2015-09-01 DIAGNOSIS — O402XX0 Polyhydramnios, second trimester, not applicable or unspecified: Secondary | ICD-10-CM

## 2015-09-01 NOTE — Progress Notes (Signed)
Subjective:  Isabel Weber is a 29 y.o. G1P0 at 3529w1d being seen today for ongoing prenatal care.  Patient reports no complaints.  Contractions: Not present.  Vag. Bleeding: None. Movement: Present. Denies leaking of fluid.   The following portions of the patient's history were reviewed and updated as appropriate: allergies, current medications, past family history, past medical history, past social history, past surgical history and problem list. Problem list updated.  Objective:   Filed Vitals:   09/01/15 0833  BP: 135/76  Pulse: 105  Weight: 203 lb (92.08 kg)    Fetal Status: Fetal Heart Rate (bpm): 154   Movement: Present     General:  Alert, oriented and cooperative. Patient is in no acute distress.  Skin: Skin is warm and dry. No rash noted.   Cardiovascular: Normal heart rate noted  Respiratory: Normal respiratory effort, no problems with respiration noted  Abdomen: Soft, gravid, appropriate for gestational age. Pain/Pressure: Absent     Pelvic: Vag. Bleeding: None Vag D/C Character: Thin   Cervical exam deferred        Extremities: Normal range of motion.  Edema: Trace  Mental Status: Normal mood and affect. Normal behavior. Normal judgment and thought content.   Urinalysis: Urine Protein: Negative Urine Glucose: Negative  Assessment and Plan:  Pregnancy: G1P0 at 5929w1d  1. Encounter for supervision of normal first pregnancy in second trimester Patient is doing well without complaints.  - Glucose tolerance, 1 hour - CBC - HIV antibody - RPR - Tdap vaccine greater than or equal to 7yo IM  2. Polyhydramnios, antepartum, second trimester, not applicable or unspecified fetus Persistent polyhydramnios noted on 10/7 ultrasound. Patient scheduled for weekly BPP starting at 28 weeks    Preterm labor symptoms and general obstetric precautions including but not limited to vaginal bleeding, contractions, leaking of fluid and fetal movement were reviewed in detail with the  patient. Please refer to After Visit Summary for other counseling recommendations.  Return in about 2 weeks (around 09/15/2015).   Catalina AntiguaPeggy Phinley Schall, MD

## 2015-09-02 LAB — RPR: RPR: NONREACTIVE

## 2015-09-02 LAB — CBC
HEMOGLOBIN: 10.5 g/dL — AB (ref 11.1–15.9)
Hematocrit: 31.9 % — ABNORMAL LOW (ref 34.0–46.6)
MCH: 29.1 pg (ref 26.6–33.0)
MCHC: 32.9 g/dL (ref 31.5–35.7)
MCV: 88 fL (ref 79–97)
Platelets: 389 10*3/uL — ABNORMAL HIGH (ref 150–379)
RBC: 3.61 x10E6/uL — AB (ref 3.77–5.28)
RDW: 13.3 % (ref 12.3–15.4)
WBC: 9.6 10*3/uL (ref 3.4–10.8)

## 2015-09-02 LAB — HIV ANTIBODY (ROUTINE TESTING W REFLEX): HIV SCREEN 4TH GENERATION: NONREACTIVE

## 2015-09-02 LAB — GLUCOSE TOLERANCE, 1 HOUR: GLUCOSE, 1HR PP: 136 mg/dL (ref 65–199)

## 2015-09-05 ENCOUNTER — Telehealth: Payer: Self-pay | Admitting: *Deleted

## 2015-09-05 NOTE — Telephone Encounter (Signed)
Called pt, left message for pt to call office.

## 2015-09-05 NOTE — Telephone Encounter (Signed)
-----   Message from Peggy Constant, MD sent at 09/02/2015  1:42 PM EDT ----- Please inform patient of abnormal 1 hour and need for 3 hour test  Thanks  Peggy 

## 2015-09-05 NOTE — Telephone Encounter (Signed)
Pt called back, informed her of 1 hr GTT result and the recommendation to have the 3 hr GTT for verification.  Pt will come in on 09-12-15 @ 815.  Informed to come in fasting that morning. Pt acknowledged instructions.

## 2015-09-05 NOTE — Telephone Encounter (Signed)
-----   Message from Catalina AntiguaPeggy Constant, MD sent at 09/02/2015  1:42 PM EDT ----- Please inform patient of abnormal 1 hour and need for 3 hour test  Thanks  Peggy

## 2015-09-12 ENCOUNTER — Other Ambulatory Visit (INDEPENDENT_AMBULATORY_CARE_PROVIDER_SITE_OTHER): Payer: BLUE CROSS/BLUE SHIELD | Admitting: *Deleted

## 2015-09-12 DIAGNOSIS — R7302 Impaired glucose tolerance (oral): Secondary | ICD-10-CM

## 2015-09-12 DIAGNOSIS — R7309 Other abnormal glucose: Secondary | ICD-10-CM

## 2015-09-12 NOTE — Progress Notes (Signed)
Pt here for a 3 hr GTT today.

## 2015-09-13 LAB — GESTATIONAL GLUCOSE TOLERANCE
GLUCOSE 3 HOUR GTT: 89 mg/dL (ref 65–139)
Glucose, Fasting: 56 mg/dL — ABNORMAL LOW (ref 65–94)
Glucose, GTT - 1 Hour: 156 mg/dL (ref 65–179)
Glucose, GTT - 2 Hour: 134 mg/dL (ref 65–154)

## 2015-09-14 ENCOUNTER — Encounter: Payer: BLUE CROSS/BLUE SHIELD | Admitting: Obstetrics & Gynecology

## 2015-09-15 ENCOUNTER — Ambulatory Visit (INDEPENDENT_AMBULATORY_CARE_PROVIDER_SITE_OTHER): Payer: BLUE CROSS/BLUE SHIELD | Admitting: Obstetrics and Gynecology

## 2015-09-15 ENCOUNTER — Encounter: Payer: Self-pay | Admitting: Obstetrics and Gynecology

## 2015-09-15 VITALS — BP 111/72 | HR 111 | Wt 207.0 lb

## 2015-09-15 DIAGNOSIS — Z3403 Encounter for supervision of normal first pregnancy, third trimester: Secondary | ICD-10-CM

## 2015-09-15 DIAGNOSIS — R87810 Cervical high risk human papillomavirus (HPV) DNA test positive: Secondary | ICD-10-CM

## 2015-09-15 DIAGNOSIS — R87610 Atypical squamous cells of undetermined significance on cytologic smear of cervix (ASC-US): Secondary | ICD-10-CM

## 2015-09-15 DIAGNOSIS — O403XX0 Polyhydramnios, third trimester, not applicable or unspecified: Secondary | ICD-10-CM

## 2015-09-15 NOTE — Progress Notes (Signed)
Subjective:  Isabel Weber is a 29 y.o. G1P0 at 2552w1d being seen today for ongoing prenatal care.  Patient reports lower extremity edema.  Contractions: Not present.  Vag. Bleeding: None. Movement: Present. Denies leaking of fluid.   The following portions of the patient's history were reviewed and updated as appropriate: allergies, current medications, past family history, past medical history, past social history, past surgical history and problem list. Problem list updated.  Objective:   Filed Vitals:   09/15/15 0853  BP: 111/72  Pulse: 111  Weight: 207 lb (93.895 kg)    Fetal Status: Fetal Heart Rate (bpm): 160   Movement: Present     General:  Alert, oriented and cooperative. Patient is in no acute distress.  Skin: Skin is warm and dry. No rash noted.   Cardiovascular: Normal heart rate noted  Respiratory: Normal respiratory effort, no problems with respiration noted  Abdomen: Soft, gravid, appropriate for gestational age. Pain/Pressure: Absent     Pelvic: Vag. Bleeding: None Vag D/C Character: Thin   Cervical exam deferred        Extremities: Normal range of motion.  Edema: Trace  Mental Status: Normal mood and affect. Normal behavior. Normal judgment and thought content.   Urinalysis: Urine Protein: Negative Urine Glucose: Negative  Assessment and Plan:  Pregnancy: G1P0 at 6552w1d  1. Encounter for supervision of normal first pregnancy in third trimester Advised the use of supportive stockings  2. Polyhydramnios, antepartum, third trimester, not applicable or unspecified fetus Normal 3 hour glucola test Follow up ultrasound tomorrow   Preterm labor symptoms and general obstetric precautions including but not limited to vaginal bleeding, contractions, leaking of fluid and fetal movement were reviewed in detail with the patient. Please refer to After Visit Summary for other counseling recommendations.  Return in about 2 weeks (around 09/29/2015).   Catalina AntiguaPeggy Zeppelin Commisso,  MD

## 2015-09-16 ENCOUNTER — Ambulatory Visit (HOSPITAL_COMMUNITY)
Admission: RE | Admit: 2015-09-16 | Discharge: 2015-09-16 | Disposition: A | Discharge status: Home / Self Care | Payer: BLUE CROSS/BLUE SHIELD | Source: Ambulatory Visit | Attending: Obstetrics & Gynecology | Admitting: Obstetrics & Gynecology

## 2015-09-16 ENCOUNTER — Other Ambulatory Visit (HOSPITAL_COMMUNITY): Payer: Self-pay | Admitting: Obstetrics and Gynecology

## 2015-09-16 ENCOUNTER — Encounter (HOSPITAL_COMMUNITY): Payer: Self-pay

## 2015-09-16 VITALS — BP 113/50 | HR 106 | Wt 209.0 lb

## 2015-09-16 DIAGNOSIS — Z3403 Encounter for supervision of normal first pregnancy, third trimester: Secondary | ICD-10-CM

## 2015-09-16 DIAGNOSIS — O402XX0 Polyhydramnios, second trimester, not applicable or unspecified: Secondary | ICD-10-CM

## 2015-09-16 DIAGNOSIS — O403XX0 Polyhydramnios, third trimester, not applicable or unspecified: Secondary | ICD-10-CM

## 2015-09-23 ENCOUNTER — Ambulatory Visit (HOSPITAL_COMMUNITY)
Admission: RE | Admit: 2015-09-23 | Discharge: 2015-09-23 | Disposition: A | Discharge status: Home / Self Care | Payer: BLUE CROSS/BLUE SHIELD | Source: Ambulatory Visit | Attending: Obstetrics & Gynecology | Admitting: Obstetrics & Gynecology

## 2015-09-23 ENCOUNTER — Encounter (HOSPITAL_COMMUNITY): Payer: Self-pay

## 2015-09-23 ENCOUNTER — Other Ambulatory Visit (HOSPITAL_COMMUNITY): Payer: Self-pay | Admitting: *Deleted

## 2015-09-23 DIAGNOSIS — O403XX0 Polyhydramnios, third trimester, not applicable or unspecified: Secondary | ICD-10-CM | POA: Diagnosis not present

## 2015-09-23 DIAGNOSIS — Z3A29 29 weeks gestation of pregnancy: Secondary | ICD-10-CM | POA: Insufficient documentation

## 2015-09-23 DIAGNOSIS — O409XX0 Polyhydramnios, unspecified trimester, not applicable or unspecified: Secondary | ICD-10-CM

## 2015-09-29 ENCOUNTER — Encounter: Payer: Self-pay | Admitting: Obstetrics & Gynecology

## 2015-09-29 ENCOUNTER — Ambulatory Visit (INDEPENDENT_AMBULATORY_CARE_PROVIDER_SITE_OTHER): Payer: BLUE CROSS/BLUE SHIELD | Admitting: Obstetrics & Gynecology

## 2015-09-29 ENCOUNTER — Other Ambulatory Visit (HOSPITAL_COMMUNITY): Payer: Self-pay | Admitting: Obstetrics and Gynecology

## 2015-09-29 VITALS — BP 118/76 | HR 112 | Wt 215.0 lb

## 2015-09-29 DIAGNOSIS — O403XX0 Polyhydramnios, third trimester, not applicable or unspecified: Secondary | ICD-10-CM

## 2015-09-29 DIAGNOSIS — O409XX0 Polyhydramnios, unspecified trimester, not applicable or unspecified: Secondary | ICD-10-CM

## 2015-09-29 DIAGNOSIS — Z3403 Encounter for supervision of normal first pregnancy, third trimester: Secondary | ICD-10-CM

## 2015-09-29 DIAGNOSIS — Z3A30 30 weeks gestation of pregnancy: Secondary | ICD-10-CM

## 2015-09-29 NOTE — Progress Notes (Signed)
Subjective:  Isabel Weber is a 29 y.o. G1P0 at 6629w1d being seen today for ongoing prenatal care.  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 I have suggested 1st shift and only 8 hours per day.  Contractions: Irregular.  Vag. Bleeding: None. Movement: Present. Denies leaking of fluid.   The following portions of the patient's history were reviewed and updated as appropriate: allergies, current medications, past family history, past medical history, past social history, past surgical history and problem list. Problem list updated.  Objective:   Filed Vitals:   09/29/15 0900  BP: 118/76  Pulse: 112  Weight: 215 lb (97.523 kg)    Fetal Status: Fetal Heart Rate (bpm): 150 Fundal Height: 38 cm Movement: Present     General:  Alert, oriented and cooperative. Patient is in no acute distress.  Skin: Skin is warm and dry. No rash noted.   Cardiovascular: Normal heart rate noted  Respiratory: Normal respiratory effort, no problems with respiration noted  Abdomen: Soft, gravid, appropriate for gestational age. Pain/Pressure: Absent     Pelvic: Vag. Bleeding: None Vag D/C Character: Thin   Cervical exam performed        Extremities: Normal range of motion.  Edema: Trace  Mental Status: Normal mood and affect. Normal behavior. Normal judgment and thought content.   Urinalysis: Urine Protein: Negative Urine Glucose: Negative  Assessment and Plan:  Pregnancy: G1P0 at 7429w1d  There are no diagnoses linked to this encounter. Preterm labor symptoms and general obstetric precautions including but not limited to vaginal bleeding, contractions, leaking of fluid and fetal movement were reviewed in detail with the patient. Please refer to After Visit Summary for other counseling recommendations.  Return in about 2 weeks (around 10/13/2015).   Allie BossierMyra C Gabrella Stroh, MD

## 2015-09-30 ENCOUNTER — Ambulatory Visit (HOSPITAL_COMMUNITY)
Admission: RE | Admit: 2015-09-30 | Discharge: 2015-09-30 | Disposition: A | Discharge status: Home / Self Care | Payer: BLUE CROSS/BLUE SHIELD | Source: Ambulatory Visit | Attending: Obstetrics & Gynecology | Admitting: Obstetrics & Gynecology

## 2015-09-30 DIAGNOSIS — O403XX0 Polyhydramnios, third trimester, not applicable or unspecified: Secondary | ICD-10-CM | POA: Diagnosis present

## 2015-09-30 DIAGNOSIS — O409XX0 Polyhydramnios, unspecified trimester, not applicable or unspecified: Secondary | ICD-10-CM

## 2015-09-30 DIAGNOSIS — Z3A30 30 weeks gestation of pregnancy: Secondary | ICD-10-CM | POA: Diagnosis not present

## 2015-10-07 ENCOUNTER — Encounter (HOSPITAL_COMMUNITY): Payer: Self-pay

## 2015-10-07 ENCOUNTER — Ambulatory Visit (HOSPITAL_COMMUNITY)
Admission: RE | Admit: 2015-10-07 | Discharge: 2015-10-07 | Disposition: A | Discharge status: Home / Self Care | Payer: BLUE CROSS/BLUE SHIELD | Source: Ambulatory Visit | Attending: Obstetrics & Gynecology | Admitting: Obstetrics & Gynecology

## 2015-10-07 DIAGNOSIS — Z3A31 31 weeks gestation of pregnancy: Secondary | ICD-10-CM | POA: Insufficient documentation

## 2015-10-07 DIAGNOSIS — O409XX0 Polyhydramnios, unspecified trimester, not applicable or unspecified: Secondary | ICD-10-CM

## 2015-10-07 DIAGNOSIS — O403XX0 Polyhydramnios, third trimester, not applicable or unspecified: Secondary | ICD-10-CM | POA: Insufficient documentation

## 2015-10-12 ENCOUNTER — Ambulatory Visit (INDEPENDENT_AMBULATORY_CARE_PROVIDER_SITE_OTHER): Payer: BLUE CROSS/BLUE SHIELD | Admitting: Obstetrics & Gynecology

## 2015-10-12 VITALS — BP 115/78 | HR 112 | Wt 220.0 lb

## 2015-10-12 DIAGNOSIS — O403XX0 Polyhydramnios, third trimester, not applicable or unspecified: Secondary | ICD-10-CM

## 2015-10-12 DIAGNOSIS — O0993 Supervision of high risk pregnancy, unspecified, third trimester: Secondary | ICD-10-CM

## 2015-10-12 NOTE — Progress Notes (Signed)
Subjective:  Isabel Weber is a 29 y.o. G1P0 at 2846w0d being seen today for ongoing prenatal care.  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  Patient reports no complaints.  Contractions: Irregular. Vag. Bleeding: None.  Movement: Present. Denies leaking of fluid.   The following portions of the patient's history were reviewed and updated as appropriate: allergies, current medications, past family history, past medical history, past social history, past surgical history and problem list. Problem list updated.  Objective:   Filed Vitals:   10/12/15 1135  BP: 115/78  Pulse: 112  Weight: 220 lb (99.791 kg)    Fetal Status: Fetal Heart Rate (bpm): 145 Fundal Height: 38 cm Movement: Present     General:  Alert, oriented and cooperative. Patient is in no acute distress.  Skin: Skin is warm and dry. No rash noted.   Cardiovascular: Normal heart rate noted  Respiratory: Normal respiratory effort, no problems with respiration noted  Abdomen: Soft, gravid, appropriate for gestational age. Pain/Pressure: Present     Pelvic: Vag. Bleeding: None     Cervical exam deferred        Extremities: Normal range of motion.  Edema: Trace  Mental Status: Normal mood and affect. Normal behavior. Normal judgment and thought content.   Urinalysis: Urine Protein: Trace Urine Glucose: Negative  Assessment and Plan:  Pregnancy: G1P0 at 5946w0d  1. Polyhydramnios, antepartum, third trimester, not applicable or unspecified fetus Polyhydramnios noted at [redacted] weeks GA -> [redacted] weeks GA. Weekly MFM BPPs have all been 8/8.  However on 11/18 at 1864w2d; AFI 20.18 cm, BPP 8/8.  As per [facility], will recheck on 11/25. If normal, no further testing.  If polyhydramnios still present, will continue weekly BPPs and delivery at 39 weeks will be  indicated.  2. Supervision of high-risk pregnancy, third trimester Preterm labor symptoms and general obstetric precautions including but not limited to vaginal bleeding, contractions, leaking of fluid and fetal movement were reviewed in detail with the patient. Please refer to After Visit Summary for other counseling recommendations.  Return in about 2 weeks (around 10/26/2015) for OB Visit.   Tereso NewcomerUgonna A Theone Bowell, MD

## 2015-10-12 NOTE — Patient Instructions (Signed)
Return to clinic for any obstetric concerns or go to [facility] for evaluation  

## 2015-10-14 ENCOUNTER — Encounter (HOSPITAL_COMMUNITY): Payer: Self-pay

## 2015-10-14 ENCOUNTER — Other Ambulatory Visit (HOSPITAL_COMMUNITY): Payer: Self-pay | Admitting: Obstetrics and Gynecology

## 2015-10-14 ENCOUNTER — Ambulatory Visit (HOSPITAL_COMMUNITY)
Admission: RE | Admit: 2015-10-14 | Discharge: 2015-10-14 | Disposition: A | Discharge status: Home / Self Care | Payer: BLUE CROSS/BLUE SHIELD | Source: Ambulatory Visit | Attending: Obstetrics & Gynecology | Admitting: Obstetrics & Gynecology

## 2015-10-14 DIAGNOSIS — O403XX0 Polyhydramnios, third trimester, not applicable or unspecified: Secondary | ICD-10-CM | POA: Diagnosis not present

## 2015-10-14 DIAGNOSIS — Z3A32 32 weeks gestation of pregnancy: Secondary | ICD-10-CM | POA: Diagnosis not present

## 2015-10-14 DIAGNOSIS — O409XX0 Polyhydramnios, unspecified trimester, not applicable or unspecified: Secondary | ICD-10-CM

## 2015-10-21 ENCOUNTER — Ambulatory Visit (HOSPITAL_COMMUNITY): Payer: BLUE CROSS/BLUE SHIELD

## 2015-10-26 ENCOUNTER — Ambulatory Visit (INDEPENDENT_AMBULATORY_CARE_PROVIDER_SITE_OTHER): Payer: BLUE CROSS/BLUE SHIELD | Admitting: Obstetrics & Gynecology

## 2015-10-26 VITALS — BP 116/74 | HR 109 | Wt 230.0 lb

## 2015-10-26 DIAGNOSIS — Z3493 Encounter for supervision of normal pregnancy, unspecified, third trimester: Secondary | ICD-10-CM

## 2015-10-26 DIAGNOSIS — Z3403 Encounter for supervision of normal first pregnancy, third trimester: Secondary | ICD-10-CM

## 2015-10-26 NOTE — Progress Notes (Signed)
Subjective:  Isabel Weber is a 29 y.o. G1P0 at 8672w0d being seen today for ongoing prenatal care.  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  Was followed for polyhydramnios but has been resolved since last two scans.  Patient reports no complaints.  Contractions: Irregular. Vag. Bleeding: None.  Movement: Present. Denies leaking of fluid.   The following portions of the patient's history were reviewed and updated as appropriate: allergies, current medications, past family history, past medical history, past social history, past surgical history and problem list. Problem list updated.  Objective:   Filed Vitals:   10/26/15 0950  BP: 116/74  Pulse: 109  Weight: 230 lb (104.327 kg)    Fetal Status: Fetal Heart Rate (bpm): 141 Fundal Height: 38 cm Movement: Present     General:  Alert, oriented and cooperative. Patient is in no acute distress.  Skin: Skin is warm and dry. No rash noted.   Cardiovascular: Normal heart rate noted  Respiratory: Normal respiratory effort, no problems with respiration noted  Abdomen: Soft, gravid, appropriate for gestational age. Pain/Pressure: Present     Pelvic: Vag. Bleeding: None Vag D/C Character: Thin  Cervical exam deferred        Extremities: Normal range of motion.  Edema: Trace  Mental Status: Normal mood and affect. Normal behavior. Normal judgment and thought content.   Urinalysis: Urine Protein: Negative Urine Glucose: Negative  Assessment and Plan:  Pregnancy: G1P0 at 2372w0d Supervision of normal pregnancy, third trimester Preterm labor symptoms and general obstetric precautions including but not limited to vaginal bleeding, contractions, leaking of fluid and fetal movement were reviewed in detail with the patient. Please refer to After Visit Summary for other counseling  recommendations.  Return in about 2 weeks (around 11/09/2015) for OB Visit, Pelvic cultures.   Tereso NewcomerUgonna A Amalee Olsen, MD

## 2015-10-26 NOTE — Patient Instructions (Signed)
Return to clinic for any obstetric concerns or go to [facility] for evaluation  

## 2015-10-28 ENCOUNTER — Ambulatory Visit (HOSPITAL_COMMUNITY): Payer: BLUE CROSS/BLUE SHIELD

## 2015-11-04 ENCOUNTER — Ambulatory Visit (HOSPITAL_COMMUNITY): Payer: BLUE CROSS/BLUE SHIELD

## 2015-11-09 ENCOUNTER — Ambulatory Visit (INDEPENDENT_AMBULATORY_CARE_PROVIDER_SITE_OTHER): Payer: BLUE CROSS/BLUE SHIELD | Admitting: Obstetrics & Gynecology

## 2015-11-09 ENCOUNTER — Encounter: Payer: Self-pay | Admitting: Obstetrics & Gynecology

## 2015-11-09 VITALS — BP 116/66 | Wt 231.0 lb

## 2015-11-09 DIAGNOSIS — Z3493 Encounter for supervision of normal pregnancy, unspecified, third trimester: Secondary | ICD-10-CM

## 2015-11-09 DIAGNOSIS — Z36 Encounter for antenatal screening of mother: Secondary | ICD-10-CM | POA: Diagnosis not present

## 2015-11-09 DIAGNOSIS — Z113 Encounter for screening for infections with a predominantly sexual mode of transmission: Secondary | ICD-10-CM

## 2015-11-09 DIAGNOSIS — Z3403 Encounter for supervision of normal first pregnancy, third trimester: Secondary | ICD-10-CM

## 2015-11-09 LAB — OB RESULTS CONSOLE GBS: STREP GROUP B AG: POSITIVE

## 2015-11-09 NOTE — Addendum Note (Signed)
Addended by: Tandy GawHINTON, Shaquayla Klimas C on: 11/09/2015 10:36 AM   Modules accepted: Orders

## 2015-11-09 NOTE — Progress Notes (Signed)
Subjective:  Isabel Weber is a 29 y.o. S AA G1P0 at 6169w0d being seen today for ongoing prenatal care.  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  Patient reports no complaints.  Contractions: Irregular. Vag. Bleeding: None.  Movement: Present. Denies leaking of fluid.   The following portions of the patient's history were reviewed and updated as appropriate: allergies, current medications, past family history, past medical history, past social history, past surgical history and problem list. Problem list updated.  Objective:   Filed Vitals:   11/09/15 1007  BP: 116/66  Weight: 231 lb (104.781 kg)    Fetal Status: Fetal Heart Rate (bpm): 155   Movement: Present     General:  Alert, oriented and cooperative. Patient is in no acute distress.  Skin: Skin is warm and dry. No rash noted.   Cardiovascular: Normal heart rate noted  Respiratory: Normal respiratory effort, no problems with respiration noted  Abdomen: Soft, gravid, appropriate for gestational age. Pain/Pressure: Present     Pelvic: Vag. Bleeding: None Vag D/C Character: Thin   Cervical exam performed        Extremities: Normal range of motion.  Edema: Trace  Mental Status: Normal mood and affect. Normal behavior. Normal judgment and thought content.   Urinalysis: Urine Protein: Negative Urine Glucose: Negative  Assessment and Plan:  Pregnancy: G1P0 at 3169w0d  1. Supervision of normal pregnancy, third trimester - cervical cultures today  Preterm labor symptoms and general obstetric precautions including but not limited to vaginal bleeding, contractions, leaking of fluid and fetal movement were reviewed in detail with the patient. Please refer to After Visit Summary for other counseling recommendations.  No Follow-up on file.   Allie BossierMyra C Linton Stolp, MD

## 2015-11-10 LAB — CULTURE, BETA STREP (GROUP B ONLY)

## 2015-11-10 LAB — URINE CYTOLOGY ANCILLARY ONLY
CHLAMYDIA, DNA PROBE: NEGATIVE
NEISSERIA GONORRHEA: NEGATIVE

## 2015-11-11 ENCOUNTER — Encounter: Payer: Self-pay | Admitting: Obstetrics & Gynecology

## 2015-11-11 DIAGNOSIS — O99820 Streptococcus B carrier state complicating pregnancy: Secondary | ICD-10-CM | POA: Insufficient documentation

## 2015-11-16 ENCOUNTER — Ambulatory Visit (INDEPENDENT_AMBULATORY_CARE_PROVIDER_SITE_OTHER): Payer: BLUE CROSS/BLUE SHIELD | Admitting: Certified Nurse Midwife

## 2015-11-16 ENCOUNTER — Encounter: Payer: Self-pay | Admitting: Certified Nurse Midwife

## 2015-11-16 VITALS — BP 114/75 | HR 112 | Wt 233.0 lb

## 2015-11-16 DIAGNOSIS — Z3493 Encounter for supervision of normal pregnancy, unspecified, third trimester: Secondary | ICD-10-CM

## 2015-11-16 DIAGNOSIS — O403XX1 Polyhydramnios, third trimester, fetus 1: Secondary | ICD-10-CM

## 2015-11-16 DIAGNOSIS — O99820 Streptococcus B carrier state complicating pregnancy: Secondary | ICD-10-CM

## 2015-11-16 DIAGNOSIS — Z22330 Carrier of Group B streptococcus: Secondary | ICD-10-CM

## 2015-11-16 DIAGNOSIS — Z3403 Encounter for supervision of normal first pregnancy, third trimester: Secondary | ICD-10-CM

## 2015-11-16 NOTE — Progress Notes (Signed)
Subjective:  Isabel Weber is a 29 y.o. G1P0 at 3919w0d being seen today for ongoing prenatal care.  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  Patient reports no complaints.  Contractions: Irregular. Vag. Bleeding: None.  Movement: Present. Denies leaking of fluid.   The following portions of the patient's history were reviewed and updated as appropriate: allergies, current medications, past family history, past medical history, past social history, past surgical history and problem list. Problem list updated.  Objective:   Filed Vitals:   11/16/15 1333  BP: 114/75  Pulse: 112  Weight: 233 lb (105.688 kg)    Fetal Status: Fetal Heart Rate (bpm): 154 Fundal Height: 43 cm Movement: Present     General:  Alert, oriented and cooperative. Patient is in no acute distress.  Skin: Skin is warm and dry. No rash noted.   Cardiovascular: Normal heart rate noted  Respiratory: Normal respiratory effort, no problems with respiration noted  Abdomen: Soft, gravid, appropriate for gestational age. Pain/Pressure: Present     Pelvic: Vag. Bleeding: None Vag D/C Character: Thin   Cervical exam performed        Extremities: Normal range of motion.  Edema: Trace  Mental Status: Normal mood and affect. Normal behavior. Normal judgment and thought content.   Urinalysis: Urine Protein: Negative Urine Glucose: Negative  Assessment and Plan:  Pregnancy: G1P0 at 4719w0d  1. Supervision of normal pregnancy, third trimester  - US MFM OB FOLLOW UP; Future Size greater than dates' previously diagnosed with polyhydramnios 2. Group B Streptococcus carrier, +RV culture, currently pregnant  - US MFM OB FOLLOW UP; Future  3. Polyhydramnios in third trimester, antepartum, fetus 1  - US  [facility]FM OB FOLLOW UP; Future  Term labor symptoms and general obstetric precautions including but not limited to vaginal bleeding, contractions, leaking of fluid and fetal movement were reviewed in detail with the patient. Please refer to After Visit Summary for other counseling recommendations.  Return in about 1 week (around 11/23/2015).   Rhea PinkLori A Adonys Wildes, CNM

## 2015-11-16 NOTE — Patient Instructions (Signed)

## 2015-11-23 ENCOUNTER — Ambulatory Visit (HOSPITAL_COMMUNITY)
Admission: RE | Admit: 2015-11-23 | Discharge: 2015-11-23 | Disposition: A | Discharge status: Home / Self Care | Payer: BLUE CROSS/BLUE SHIELD | Source: Ambulatory Visit | Attending: Certified Nurse Midwife | Admitting: Certified Nurse Midwife

## 2015-11-23 DIAGNOSIS — O403XX0 Polyhydramnios, third trimester, not applicable or unspecified: Secondary | ICD-10-CM | POA: Diagnosis present

## 2015-11-23 DIAGNOSIS — Z3A38 38 weeks gestation of pregnancy: Secondary | ICD-10-CM | POA: Diagnosis not present

## 2015-11-23 DIAGNOSIS — Z3493 Encounter for supervision of normal pregnancy, unspecified, third trimester: Secondary | ICD-10-CM

## 2015-11-23 DIAGNOSIS — O99820 Streptococcus B carrier state complicating pregnancy: Secondary | ICD-10-CM

## 2015-11-23 DIAGNOSIS — O403XX1 Polyhydramnios, third trimester, fetus 1: Secondary | ICD-10-CM

## 2015-11-24 ENCOUNTER — Encounter: Payer: Self-pay | Admitting: Obstetrics and Gynecology

## 2015-11-24 ENCOUNTER — Ambulatory Visit (INDEPENDENT_AMBULATORY_CARE_PROVIDER_SITE_OTHER): Payer: BLUE CROSS/BLUE SHIELD | Admitting: Obstetrics and Gynecology

## 2015-11-24 VITALS — BP 120/79 | HR 106 | Wt 232.0 lb

## 2015-11-24 DIAGNOSIS — Z22330 Carrier of Group B streptococcus: Secondary | ICD-10-CM

## 2015-11-24 DIAGNOSIS — Z3493 Encounter for supervision of normal pregnancy, unspecified, third trimester: Secondary | ICD-10-CM

## 2015-11-24 DIAGNOSIS — R87610 Atypical squamous cells of undetermined significance on cytologic smear of cervix (ASC-US): Secondary | ICD-10-CM

## 2015-11-24 DIAGNOSIS — R87810 Cervical high risk human papillomavirus (HPV) DNA test positive: Secondary | ICD-10-CM

## 2015-11-24 DIAGNOSIS — Z3403 Encounter for supervision of normal first pregnancy, third trimester: Secondary | ICD-10-CM

## 2015-11-24 DIAGNOSIS — O99820 Streptococcus B carrier state complicating pregnancy: Secondary | ICD-10-CM

## 2015-11-24 NOTE — Progress Notes (Signed)
Subjective:  Isabel Weber is a 30 y.o. G1P0 at [redacted]w[redacted]d being seen today for ongoing prenatal care.  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  Patient reports no complaints.  Contractions: Irregular. Vag. Bleeding: None.  Movement: Present. Denies leaking of fluid.   The following portions of the patient's history were reviewed and updated as appropriate: allergies, current medications, past family history, past medical history, past social history, past surgical history and problem list. Problem list updated.  Objective:   Filed Vitals:   11/24/15 0908  BP: 120/79  Pulse: 106  Weight: 232 lb (105.235 kg)    Fetal Status: Fetal Heart Rate (bpm): 150   Movement: Present     General:  Alert, oriented and cooperative. Patient is in no acute distress.  Skin: Skin is warm and dry. No rash noted.   Cardiovascular: Normal heart rate noted  Respiratory: Normal respiratory effort, no problems with respiration noted  Abdomen: Soft, gravid, appropriate for gestational age. Pain/Pressure: Present     Pelvic: Vag. Bleeding: None Vag D/C Character: Thin   Cervical exam performed      closed/thick/high  Extremities: Normal range of motion.  Edema: Trace  Mental Status: Normal mood and affect. Normal behavior. Normal judgment and thought content.   Urinalysis: Urine Protein: Negative Urine Glucose: Negative  Assessment and Plan:  Pregnancy: G1P0 at 3174w1d  1. Supervision of normal pregnancy, third trimester Patient had ultrasound on 11/23/2015, report is unavailable for review. Patient reports normal AFI and weight of 8lb  2. Group B Streptococcus carrier, +RV culture, currently pregnant Will provide prophylaxis in labor  3. ASCUS with positive high risk HPV cervical on  pap 05/04/15   Term labor symptoms and general obstetric precautions including but not limited to vaginal bleeding, contractions, leaking of fluid and fetal movement were reviewed in detail with the patient. Please refer to After Visit Summary for other counseling recommendations.  Return in about 1 week (around 12/01/2015).   Catalina AntiguaPeggy Ernesto Lashway, MD

## 2015-11-30 ENCOUNTER — Ambulatory Visit (INDEPENDENT_AMBULATORY_CARE_PROVIDER_SITE_OTHER): Payer: BLUE CROSS/BLUE SHIELD | Admitting: Certified Nurse Midwife

## 2015-11-30 VITALS — BP 109/68 | HR 118 | Wt 236.0 lb

## 2015-11-30 DIAGNOSIS — Z3403 Encounter for supervision of normal first pregnancy, third trimester: Secondary | ICD-10-CM

## 2015-11-30 DIAGNOSIS — O99820 Streptococcus B carrier state complicating pregnancy: Secondary | ICD-10-CM

## 2015-11-30 DIAGNOSIS — Z22330 Carrier of Group B streptococcus: Secondary | ICD-10-CM

## 2015-11-30 DIAGNOSIS — Z3493 Encounter for supervision of normal pregnancy, unspecified, third trimester: Secondary | ICD-10-CM

## 2015-11-30 NOTE — Patient Instructions (Signed)

## 2015-11-30 NOTE — Progress Notes (Signed)
  Subjective:  Isabel Weber is a 30 y.o. G1P0 at 1181w0d being seen today for ongoing prenatal care.  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  Patient reports no complaints.  Contractions: Irregular. Vag. Bleeding: None.  Movement: Present. Denies leaking of fluid.   The following portions of the patient's history were reviewed and updated as appropriate: allergies, current medications, past family history, past medical history, past social history, past surgical history and problem list. Problem list updated.  Objective:   Filed Vitals:   11/30/15 1415  BP: 109/68  Pulse: 118  Weight: 236 lb (107.049 kg)    Fetal Status: Fetal Heart Rate (bpm): + on us   Movement: Present     General:  Alert, oriented and cooperative. Patient is in no acute distress.  Skin: Skin is warm and dry. No rash noted.   Cardiovascular: Normal heart rate noted  Respiratory: Normal respiratory effort, no problems with respiration noted  Abdomen: Soft, gravid, appropriate for gestational age. Pain/Pressure: Present     Pelvic: Vag. Bleeding: None Vag D/C Character: Thin   Cervical exam performed        Extremities: Normal range of motion.  Edema: Trace  Mental Status: Normal mood and affect. Normal behavior. Normal judgment and thought content.   Urinalysis: Urine Protein: Negative Urine Glucose: Negative  Assessment and Plan:  Pregnancy: G1P0 at 6381w0d  1. Group B Streptococcus carrier, +RV culture, currently pregnant 0/th/-3  2. Supervision of normal pregnancy, third trimester   Term labor symptoms and general obstetric precautions including but not limited to vaginal bleeding, contractions, leaking of fluid and fetal movement were reviewed in detail with the patient. Please  refer to After Visit Summary for other counseling recommendations.  No Follow-up on file.   Rhea PinkLori A Clemmons, CNM

## 2015-12-07 ENCOUNTER — Ambulatory Visit (INDEPENDENT_AMBULATORY_CARE_PROVIDER_SITE_OTHER): Payer: BLUE CROSS/BLUE SHIELD | Admitting: Obstetrics and Gynecology

## 2015-12-07 ENCOUNTER — Encounter: Payer: Self-pay | Admitting: Obstetrics and Gynecology

## 2015-12-07 VITALS — BP 127/79 | HR 111 | Wt 236.0 lb

## 2015-12-07 DIAGNOSIS — Z3493 Encounter for supervision of normal pregnancy, unspecified, third trimester: Secondary | ICD-10-CM

## 2015-12-07 DIAGNOSIS — Z22330 Carrier of Group B streptococcus: Secondary | ICD-10-CM

## 2015-12-07 DIAGNOSIS — R87810 Cervical high risk human papillomavirus (HPV) DNA test positive: Secondary | ICD-10-CM

## 2015-12-07 DIAGNOSIS — O99820 Streptococcus B carrier state complicating pregnancy: Secondary | ICD-10-CM

## 2015-12-07 DIAGNOSIS — Z3403 Encounter for supervision of normal first pregnancy, third trimester: Secondary | ICD-10-CM

## 2015-12-07 DIAGNOSIS — O480 Post-term pregnancy: Secondary | ICD-10-CM

## 2015-12-07 DIAGNOSIS — R87610 Atypical squamous cells of undetermined significance on cytologic smear of cervix (ASC-US): Secondary | ICD-10-CM

## 2015-12-07 NOTE — Progress Notes (Signed)
Subjective:  Isabel Weber is a 30 y.o. G1P0 at [redacted]w[redacted]d being seen today for ongoing prenatal care.  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  Patient reports no complaints.  Contractions: Irregular. Vag. Bleeding: None.  Movement: Present. Denies leaking of fluid.   The following portions of the patient's history were reviewed and updated as appropriate: allergies, current medications, past family history, past medical history, past social history, past surgical history and problem list. Problem list updated.  Objective:   Filed Vitals:   12/07/15 1125  BP: 127/79  Pulse: 111  Weight: 236 lb (107.049 kg)    Fetal Status:     Movement: Present     General:  Alert, oriented and cooperative. Patient is in no acute distress.  Skin: Skin is warm and dry. No rash noted.   Cardiovascular: Normal heart rate noted  Respiratory: Normal respiratory effort, no problems with respiration noted  Abdomen: Soft, gravid, appropriate for gestational age. Pain/Pressure: Present     Pelvic: Vag. Bleeding: None Vag D/C Character: Thin   Cervical exam performed      0.5/thick/high  Extremities: Normal range of motion.  Edema: Mild pitting, slight indentation  Mental Status: Normal mood and affect. Normal behavior. Normal judgment and thought content.   Urinalysis:      Assessment and Plan:  Pregnancy: G1P0 at [redacted]w[redacted]d  1. Supervision of normal pregnancy, third trimester Patient is doing well.  Will return next week for post date testing Will schedule IOL at 41 weeks- 12/14/2015 NST- reviewed and reactive   2. Group B Streptococcus carrier, +RV culture, currently pregnant Will need antibiotics in labor  3. 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 Please refer to After Visit Summary for other counseling recommendations.  Return in about 5 days (around 12/12/2015) for ob f/u with NST/AFI.   Catalina Antigua, MD

## 2015-12-09 ENCOUNTER — Telehealth (HOSPITAL_COMMUNITY): Payer: Self-pay | Admitting: *Deleted

## 2015-12-09 NOTE — Telephone Encounter (Signed)
Preadmission screen  

## 2015-12-13 ENCOUNTER — Ambulatory Visit (INDEPENDENT_AMBULATORY_CARE_PROVIDER_SITE_OTHER): Payer: BLUE CROSS/BLUE SHIELD | Admitting: Obstetrics & Gynecology

## 2015-12-13 ENCOUNTER — Encounter (HOSPITAL_COMMUNITY): Payer: Self-pay | Admitting: *Deleted

## 2015-12-13 ENCOUNTER — Telehealth (HOSPITAL_COMMUNITY): Payer: Self-pay | Admitting: *Deleted

## 2015-12-13 VITALS — BP 126/58 | HR 108

## 2015-12-13 DIAGNOSIS — O480 Post-term pregnancy: Secondary | ICD-10-CM | POA: Diagnosis not present

## 2015-12-13 DIAGNOSIS — IMO0002 Reserved for concepts with insufficient information to code with codable children: Secondary | ICD-10-CM

## 2015-12-13 DIAGNOSIS — Z3493 Encounter for supervision of normal pregnancy, unspecified, third trimester: Secondary | ICD-10-CM

## 2015-12-13 DIAGNOSIS — O99820 Streptococcus B carrier state complicating pregnancy: Secondary | ICD-10-CM

## 2015-12-13 DIAGNOSIS — O99210 Obesity complicating pregnancy, unspecified trimester: Secondary | ICD-10-CM

## 2015-12-13 DIAGNOSIS — O26843 Uterine size-date discrepancy, third trimester: Secondary | ICD-10-CM

## 2015-12-13 DIAGNOSIS — Z3403 Encounter for supervision of normal first pregnancy, third trimester: Secondary | ICD-10-CM

## 2015-12-13 DIAGNOSIS — Z22330 Carrier of Group B streptococcus: Secondary | ICD-10-CM

## 2015-12-13 NOTE — Telephone Encounter (Signed)
Preadmission screen  

## 2015-12-13 NOTE — Progress Notes (Signed)
Subjective:  Isabel Weber is a 30 y.o. G1P0 at [redacted]w[redacted]d being seen today for ongoing prenatal care.  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  Patient reports no complaints.  Contractions: Irregular. Vag. Bleeding: None.  Movement: Present. Denies leaking of fluid.   The following portions of the patient's history were reviewed and updated as appropriate: allergies, current medications, past family history, past medical history, past social history, past surgical history and problem list. Problem list updated.  Objective:   Filed Vitals:   12/13/15 0942  BP: 126/58  Pulse: 108    Fetal Status: Fetal Heart Rate (bpm): 139 Fundal Height: 45 cm Movement: Present  Presentation: Vertex  General:  Alert, oriented and cooperative. Patient is in no acute distress.  Skin: Skin is warm and dry. No rash noted.   Cardiovascular: Normal heart rate noted  Respiratory: Normal respiratory effort, no problems with respiration noted  Abdomen: Soft, gravid, appropriate for gestational age. Pain/Pressure: Present     Pelvic: Vag. Bleeding: None Vag D/C Character: Thin   Cervical exam performed Dilation: Fingertip Effacement (%): Thick Station: Ballotable  Extremities: Normal range of motion.  Edema: Mild pitting, slight indentation  Mental Status: Normal mood and affect. Normal behavior. Normal judgment and thought content.   Urinalysis: Urine Protein: Negative Urine Glucose: Negative  Assessment and Plan:  Pregnancy: G1P0 at [redacted]w[redacted]d  1. Supervision of normal pregnancy, third trimester  - Fetal nonstress test  2. Group B Streptococcus carrier, +RV culture, currently pregnant   3. Obesity in pregnancy - She is scheduled for IOL at midnight tomorrow. I  feel that with measuring larger than her dates, I will get another u/s for growth. Her 38 week u/s showed 7#6 ounce EFW.  4. LGA (large for gestational age) fetus  - [place] MFM OB FOLLOW UP; Future  Term labor symptoms and general obstetric precautions including but not limited to vaginal bleeding, contractions, leaking of fluid and fetal movement were reviewed in detail with the patient. Please refer to After Visit Summary for other counseling recommendations.  Return in about 6 weeks (around 01/24/2016) for postpartum visit.   Allie Bossier, MD

## 2015-12-14 ENCOUNTER — Other Ambulatory Visit: Payer: Self-pay | Admitting: Advanced Practice Midwife

## 2015-12-14 ENCOUNTER — Ambulatory Visit (HOSPITAL_COMMUNITY)
Admission: RE | Admit: 2015-12-14 | Discharge: 2015-12-14 | Disposition: A | Discharge status: Home / Self Care | Payer: BLUE CROSS/BLUE SHIELD | Source: Ambulatory Visit | Attending: Obstetrics & Gynecology | Admitting: Obstetrics & Gynecology

## 2015-12-14 ENCOUNTER — Other Ambulatory Visit: Payer: Self-pay | Admitting: Obstetrics & Gynecology

## 2015-12-14 DIAGNOSIS — O480 Post-term pregnancy: Secondary | ICD-10-CM

## 2015-12-14 DIAGNOSIS — O26843 Uterine size-date discrepancy, third trimester: Secondary | ICD-10-CM | POA: Insufficient documentation

## 2015-12-14 DIAGNOSIS — Z8759 Personal history of other complications of pregnancy, childbirth and the puerperium: Secondary | ICD-10-CM

## 2015-12-14 DIAGNOSIS — Z3A41 41 weeks gestation of pregnancy: Secondary | ICD-10-CM

## 2015-12-14 DIAGNOSIS — O403XX0 Polyhydramnios, third trimester, not applicable or unspecified: Secondary | ICD-10-CM

## 2015-12-14 DIAGNOSIS — O99213 Obesity complicating pregnancy, third trimester: Secondary | ICD-10-CM

## 2015-12-14 DIAGNOSIS — IMO0002 Reserved for concepts with insufficient information to code with codable children: Secondary | ICD-10-CM

## 2015-12-14 DIAGNOSIS — Z3A38 38 weeks gestation of pregnancy: Secondary | ICD-10-CM

## 2015-12-15 ENCOUNTER — Encounter (HOSPITAL_COMMUNITY): Payer: Self-pay

## 2015-12-15 ENCOUNTER — Inpatient Hospital Stay (HOSPITAL_COMMUNITY)
Admission: RE | Admit: 2015-12-15 | Discharge: 2015-12-17 | DRG: 765 | Disposition: A | Discharge status: Home / Self Care | Payer: BLUE CROSS/BLUE SHIELD | Source: Ambulatory Visit | Attending: Obstetrics & Gynecology | Admitting: Obstetrics & Gynecology

## 2015-12-15 ENCOUNTER — Inpatient Hospital Stay (HOSPITAL_COMMUNITY): Payer: BLUE CROSS/BLUE SHIELD | Admitting: Anesthesiology

## 2015-12-15 ENCOUNTER — Encounter (HOSPITAL_COMMUNITY)
Admission: RE | Disposition: A | Discharge status: Home / Self Care | Payer: Self-pay | Source: Ambulatory Visit | Attending: Obstetrics & Gynecology

## 2015-12-15 VITALS — BP 118/73 | HR 103 | Temp 98.4°F | Resp 20 | Ht 62.0 in | Wt 232.0 lb

## 2015-12-15 DIAGNOSIS — O99824 Streptococcus B carrier state complicating childbirth: Secondary | ICD-10-CM | POA: Diagnosis present

## 2015-12-15 DIAGNOSIS — O480 Post-term pregnancy: Principal | ICD-10-CM | POA: Diagnosis present

## 2015-12-15 DIAGNOSIS — Z87891 Personal history of nicotine dependence: Secondary | ICD-10-CM | POA: Diagnosis not present

## 2015-12-15 DIAGNOSIS — O403XX0 Polyhydramnios, third trimester, not applicable or unspecified: Secondary | ICD-10-CM | POA: Diagnosis not present

## 2015-12-15 DIAGNOSIS — Z3A41 41 weeks gestation of pregnancy: Secondary | ICD-10-CM | POA: Diagnosis not present

## 2015-12-15 DIAGNOSIS — Z3493 Encounter for supervision of normal pregnancy, unspecified, third trimester: Secondary | ICD-10-CM

## 2015-12-15 DIAGNOSIS — Z6841 Body Mass Index (BMI) 40.0 and over, adult: Secondary | ICD-10-CM

## 2015-12-15 DIAGNOSIS — O99214 Obesity complicating childbirth: Secondary | ICD-10-CM

## 2015-12-15 DIAGNOSIS — Z3403 Encounter for supervision of normal first pregnancy, third trimester: Secondary | ICD-10-CM | POA: Diagnosis present

## 2015-12-15 DIAGNOSIS — E669 Obesity, unspecified: Secondary | ICD-10-CM | POA: Diagnosis present

## 2015-12-15 DIAGNOSIS — O99210 Obesity complicating pregnancy, unspecified trimester: Secondary | ICD-10-CM

## 2015-12-15 DIAGNOSIS — Z98891 History of uterine scar from previous surgery: Secondary | ICD-10-CM

## 2015-12-15 DIAGNOSIS — O99820 Streptococcus B carrier state complicating pregnancy: Secondary | ICD-10-CM

## 2015-12-15 LAB — CBC
HCT: 34.2 % — ABNORMAL LOW (ref 36.0–46.0)
HEMOGLOBIN: 11.7 g/dL — AB (ref 12.0–15.0)
MCH: 29.8 pg (ref 26.0–34.0)
MCHC: 34.2 g/dL (ref 30.0–36.0)
MCV: 87.2 fL (ref 78.0–100.0)
PLATELETS: 255 10*3/uL (ref 150–400)
RBC: 3.92 MIL/uL (ref 3.87–5.11)
RDW: 14.5 % (ref 11.5–15.5)
WBC: 9 10*3/uL (ref 4.0–10.5)

## 2015-12-15 LAB — ABO/RH: ABO/RH(D): O POS

## 2015-12-15 LAB — TYPE AND SCREEN
ABO/RH(D): O POS
ANTIBODY SCREEN: NEGATIVE

## 2015-12-15 SURGERY — Surgical Case
Anesthesia: Epidural

## 2015-12-15 MED ORDER — OXYTOCIN 10 UNIT/ML IJ SOLN
INTRAMUSCULAR | Status: AC
  Filled 2015-12-15: qty 4

## 2015-12-15 MED ORDER — ONDANSETRON HCL 4 MG/2ML IJ SOLN: 4.0000 mg | Freq: Four times a day (QID) | INTRAMUSCULAR | Status: DC | PRN

## 2015-12-15 MED ORDER — DIPHENHYDRAMINE HCL 50 MG/ML IJ SOLN: 12.5000 mg | INTRAMUSCULAR | Status: DC | PRN

## 2015-12-15 MED ORDER — FENTANYL 2.5 MCG/ML BUPIVACAINE 1/10 % EPIDURAL INFUSION (WH - ANES)
INTRAMUSCULAR | Status: AC
  Filled 2015-12-15: qty 125

## 2015-12-15 MED ORDER — BUPIVACAINE HCL (PF) 0.5 % IJ SOLN
INTRAMUSCULAR | Status: AC
  Filled 2015-12-15: qty 30

## 2015-12-15 MED ORDER — TERBUTALINE SULFATE 1 MG/ML IJ SOLN
0.2500 mg | Freq: Once | INTRAMUSCULAR | Status: AC | PRN
  Administered 2015-12-15: 0.25 mg via SUBCUTANEOUS
  Filled 2015-12-15: qty 1

## 2015-12-15 MED ORDER — LIDOCAINE-EPINEPHRINE (PF) 2 %-1:200000 IJ SOLN
INTRAMUSCULAR | Status: AC
  Filled 2015-12-15: qty 20

## 2015-12-15 MED ORDER — MEPERIDINE HCL 25 MG/ML IJ SOLN
INTRAMUSCULAR | Status: DC | PRN
  Administered 2015-12-15 (×2): 12.5 mg via INTRAVENOUS

## 2015-12-15 MED ORDER — LIDOCAINE HCL (PF) 1 % IJ SOLN: 30.0000 mL | INTRAMUSCULAR | Status: DC | PRN

## 2015-12-15 MED ORDER — EPHEDRINE 5 MG/ML INJ
10.0000 mg | INTRAVENOUS | Status: DC | PRN
Start: 2015-12-15 — End: 2015-12-16
  Administered 2015-12-15: 10 mg via INTRAVENOUS
  Filled 2015-12-15: qty 4

## 2015-12-15 MED ORDER — CITRIC ACID-SODIUM CITRATE 334-500 MG/5ML PO SOLN
30.0000 mL | ORAL | Status: DC | PRN
  Administered 2015-12-15 (×2): 30 mL via ORAL
  Filled 2015-12-15 (×2): qty 15

## 2015-12-15 MED ORDER — LACTATED RINGERS IV SOLN: 2.5000 [IU]/h | INTRAVENOUS | Status: DC

## 2015-12-15 MED ORDER — LACTATED RINGERS IV SOLN: 500.0000 mL | INTRAVENOUS | Status: DC | PRN

## 2015-12-15 MED ORDER — SODIUM BICARBONATE 8.4 % IV SOLN
INTRAVENOUS | Status: AC
  Filled 2015-12-15: qty 50

## 2015-12-15 MED ORDER — LACTATED RINGERS IV SOLN
INTRAVENOUS | Status: DC | PRN
  Administered 2015-12-15: 23:00:00 via INTRAVENOUS

## 2015-12-15 MED ORDER — CEFAZOLIN SODIUM-DEXTROSE 2-3 GM-% IV SOLR
INTRAVENOUS | Status: DC | PRN
  Administered 2015-12-15: 2 g via INTRAVENOUS

## 2015-12-15 MED ORDER — OXYTOCIN BOLUS FROM INFUSION: 500.0000 mL | INTRAVENOUS | Status: DC

## 2015-12-15 MED ORDER — PENICILLIN G POTASSIUM 5000000 UNITS IJ SOLR
2.5000 10*6.[IU] | INTRAVENOUS | Status: DC
  Administered 2015-12-15: 2.5 10*6.[IU] via INTRAVENOUS
  Filled 2015-12-15 (×6): qty 2.5

## 2015-12-15 MED ORDER — OXYCODONE-ACETAMINOPHEN 5-325 MG PO TABS: 2.0000 | ORAL_TABLET | ORAL | Status: DC | PRN

## 2015-12-15 MED ORDER — LACTATED RINGERS IV SOLN
INTRAVENOUS | Status: DC
  Administered 2015-12-15: 21:00:00 via INTRAUTERINE

## 2015-12-15 MED ORDER — PHENYLEPHRINE HCL 10 MG/ML IJ SOLN
INTRAMUSCULAR | Status: DC | PRN
  Administered 2015-12-15: 40 ug via INTRAVENOUS
  Administered 2015-12-15 (×2): 80 ug via INTRAVENOUS

## 2015-12-15 MED ORDER — PHENYLEPHRINE 40 MCG/ML (10ML) SYRINGE FOR IV PUSH (FOR BLOOD PRESSURE SUPPORT)
PREFILLED_SYRINGE | INTRAVENOUS | Status: AC
  Filled 2015-12-15: qty 20

## 2015-12-15 MED ORDER — FENTANYL CITRATE (PF) 100 MCG/2ML IJ SOLN
100.0000 ug | INTRAMUSCULAR | Status: DC | PRN
  Administered 2015-12-15 (×2): 100 ug via INTRAVENOUS
  Filled 2015-12-15 (×2): qty 2

## 2015-12-15 MED ORDER — OXYTOCIN 10 UNIT/ML IJ SOLN
1.0000 m[IU]/min | INTRAVENOUS | Status: DC
  Administered 2015-12-15: 2 m[IU]/min via INTRAVENOUS
  Filled 2015-12-15: qty 4

## 2015-12-15 MED ORDER — LIDOCAINE HCL (PF) 1 % IJ SOLN
INTRAMUSCULAR | Status: DC | PRN
  Administered 2015-12-15 (×2): 8 mL via EPIDURAL

## 2015-12-15 MED ORDER — LACTATED RINGERS IV SOLN
INTRAVENOUS | Status: DC
  Administered 2015-12-15: 05:00:00 via INTRAVENOUS
  Administered 2015-12-15: 250 mL via INTRAVENOUS
  Administered 2015-12-15 (×4): via INTRAVENOUS

## 2015-12-15 MED ORDER — BUPIVACAINE HCL (PF) 0.25 % IJ SOLN
INTRAMUSCULAR | Status: AC
  Filled 2015-12-15: qty 30

## 2015-12-15 MED ORDER — MORPHINE SULFATE (PF) 0.5 MG/ML IJ SOLN
INTRAMUSCULAR | Status: DC | PRN
  Administered 2015-12-15: 4 mg via EPIDURAL
  Administered 2015-12-15: 1 mg via INTRAVENOUS

## 2015-12-15 MED ORDER — ONDANSETRON HCL 4 MG/2ML IJ SOLN
INTRAMUSCULAR | Status: DC | PRN
  Administered 2015-12-15: 4 mg via INTRAVENOUS

## 2015-12-15 MED ORDER — PHENYLEPHRINE 40 MCG/ML (10ML) SYRINGE FOR IV PUSH (FOR BLOOD PRESSURE SUPPORT)
80.0000 ug | PREFILLED_SYRINGE | INTRAVENOUS | Status: AC | PRN
  Administered 2015-12-15 (×3): 80 ug via INTRAVENOUS

## 2015-12-15 MED ORDER — OXYTOCIN 10 UNIT/ML IJ SOLN
40.0000 [IU] | INTRAVENOUS | Status: DC | PRN
  Administered 2015-12-15: 40 [IU] via INTRAVENOUS

## 2015-12-15 MED ORDER — ACETAMINOPHEN 325 MG PO TABS
650.0000 mg | ORAL_TABLET | ORAL | Status: DC | PRN
  Administered 2015-12-15: 650 mg via ORAL
  Filled 2015-12-15: qty 2

## 2015-12-15 MED ORDER — FENTANYL 2.5 MCG/ML BUPIVACAINE 1/10 % EPIDURAL INFUSION (WH - ANES)
14.0000 mL/h | INTRAMUSCULAR | Status: DC | PRN
  Administered 2015-12-15: 14 mL/h via EPIDURAL

## 2015-12-15 MED ORDER — ONDANSETRON HCL 4 MG/2ML IJ SOLN
INTRAMUSCULAR | Status: AC
  Filled 2015-12-15: qty 2

## 2015-12-15 MED ORDER — MEPERIDINE HCL 25 MG/ML IJ SOLN
INTRAMUSCULAR | Status: AC
  Filled 2015-12-15: qty 1

## 2015-12-15 MED ORDER — MORPHINE SULFATE (PF) 0.5 MG/ML IJ SOLN
INTRAMUSCULAR | Status: AC
  Filled 2015-12-15: qty 10

## 2015-12-15 MED ORDER — PENICILLIN G POTASSIUM 5000000 UNITS IJ SOLR
5.0000 10*6.[IU] | Freq: Once | INTRAVENOUS | Status: AC
  Administered 2015-12-15: 5 10*6.[IU] via INTRAVENOUS
  Filled 2015-12-15: qty 5

## 2015-12-15 MED ORDER — CEFAZOLIN SODIUM-DEXTROSE 2-3 GM-% IV SOLR
INTRAVENOUS | Status: AC
  Filled 2015-12-15: qty 50

## 2015-12-15 MED ORDER — MISOPROSTOL 25 MCG QUARTER TABLET
25.0000 ug | ORAL_TABLET | ORAL | Status: DC | PRN
  Administered 2015-12-15 (×2): 25 ug via VAGINAL
  Filled 2015-12-15 (×2): qty 0.25

## 2015-12-15 MED ORDER — OXYCODONE-ACETAMINOPHEN 5-325 MG PO TABS: 1.0000 | ORAL_TABLET | ORAL | Status: DC | PRN

## 2015-12-15 MED ORDER — SODIUM BICARBONATE 8.4 % IV SOLN
INTRAVENOUS | Status: DC | PRN
  Administered 2015-12-15: 10 mL via EPIDURAL

## 2015-12-15 SURGICAL SUPPLY — 36 items
BENZOIN TINCTURE PRP APPL 2/3 (GAUZE/BANDAGES/DRESSINGS) ×3 IMPLANT
BLADE TIP J-PLASMA PRECISE LAP (MISCELLANEOUS) ×3 IMPLANT
CLAMP CORD UMBIL (MISCELLANEOUS) IMPLANT
CLOSURE WOUND 1/2 X4 (GAUZE/BANDAGES/DRESSINGS) ×1
CLOTH BEACON ORANGE TIMEOUT ST (SAFETY) ×3 IMPLANT
DRAPE SHEET LG 3/4 BI-LAMINATE (DRAPES) IMPLANT
DRSG OPSITE POSTOP 4X10 (GAUZE/BANDAGES/DRESSINGS) ×3 IMPLANT
DURAPREP 26ML APPLICATOR (WOUND CARE) ×3 IMPLANT
ELECT REM PT RETURN 9FT ADLT (ELECTROSURGICAL) ×3
ELECTRODE REM PT RTRN 9FT ADLT (ELECTROSURGICAL) ×1 IMPLANT
EXTRACTOR VACUUM KIWI (MISCELLANEOUS) IMPLANT
GLOVE BIO SURGEON STRL SZ7 (GLOVE) ×3 IMPLANT
GLOVE BIOGEL PI IND STRL 7.0 (GLOVE) ×2 IMPLANT
GLOVE BIOGEL PI INDICATOR 7.0 (GLOVE) ×4
GOWN STRL REUS W/TWL LRG LVL3 (GOWN DISPOSABLE) ×6 IMPLANT
GOWN STRL REUS W/TWL XL LVL3 (GOWN DISPOSABLE) ×3 IMPLANT
KIT ABG SYR 3ML LUER SLIP (SYRINGE) IMPLANT
NEEDLE HYPO 22GX1.5 SAFETY (NEEDLE) ×3 IMPLANT
NEEDLE HYPO 25X5/8 SAFETYGLIDE (NEEDLE) IMPLANT
NS IRRIG 1000ML POUR BTL (IV SOLUTION) ×3 IMPLANT
PACK C SECTION WH (CUSTOM PROCEDURE TRAY) ×3 IMPLANT
PAD OB MATERNITY 4.3X12.25 (PERSONAL CARE ITEMS) ×3 IMPLANT
PENCIL SMOKE EVAC W/HOLSTER (ELECTROSURGICAL) ×3 IMPLANT
RTRCTR C-SECT PINK 25CM LRG (MISCELLANEOUS) IMPLANT
SPONGE SURGIFOAM ABS GEL 12-7 (HEMOSTASIS) IMPLANT
STRIP CLOSURE SKIN 1/2X4 (GAUZE/BANDAGES/DRESSINGS) ×2 IMPLANT
SUT PDS AB 0 CTX 60 (SUTURE) IMPLANT
SUT PLAIN 0 NONE (SUTURE) IMPLANT
SUT SILK 0 TIES 10X30 (SUTURE) IMPLANT
SUT VIC AB 0 CT1 36 (SUTURE) ×9 IMPLANT
SUT VIC AB 3-0 CT1 27 (SUTURE) ×2
SUT VIC AB 3-0 CT1 TAPERPNT 27 (SUTURE) ×1 IMPLANT
SUT VIC AB 4-0 KS 27 (SUTURE) IMPLANT
SYR CONTROL 10ML LL (SYRINGE) ×3 IMPLANT
TOWEL OR 17X24 6PK STRL BLUE (TOWEL DISPOSABLE) ×3 IMPLANT
TRAY FOLEY CATH SILVER 14FR (SET/KITS/TRAYS/PACK) ×3 IMPLANT

## 2015-12-15 NOTE — Op Note (Signed)
12/15/2015  11:46 PM  PATIENT:  Isabel Weber  30 y.o. female  PRE-OPERATIVE DIAGNOSIS: polyhydramnios; fetal distress   POST-OPERATIVE DIAGNOSIS:  polyhydramnios; fetal distress   PROCEDURE:  Procedure(s): CESAREAN SECTION (N/A)  SURGEON:  Surgeon(s) and Role:    * Willodean Rosenthal, MD - Primary  ANESTHESIA:   epidural  EBL:  Total I/O In: 1800 [I.V.:1800] Out: 1125 [Urine:450; Blood:675]  BLOOD ADMINISTERED:none  DRAINS: none   LOCAL MEDICATIONS USED:  MARCAINE     SPECIMEN:  Source of Specimen:  placenta  DISPOSITION OF SPECIMEN:  PATHOLOGY  COUNTS:  YES  TOURNIQUET:  * No tourniquets in log *  DICTATION: .Note written in EPIC  PLAN OF CARE: Admit to inpatient   PATIENT DISPOSITION:  PACU - hemodynamically stable.   Delay start of Pharmacological VTE agent (>24hrs) due to surgical blood loss or risk of bleeding: yes  Complications: none immediate  INDICATIONS: Isabel Weber is a 30 y.o. G2P1011 at [redacted]w[redacted]d here for cesarean section secondary to the indications listed under preoperative diagnosis; please see preoperative note for further details.  The risks of cesarean section were discussed with the patient including but were not limited to: bleeding which may require transfusion or reoperation; infection which may require antibiotics; injury to bowel, bladder, ureters or other surrounding organs; injury to the fetus; need for additional procedures including hysterectomy in the event of a life-threatening hemorrhage; placental abnormalities wth subsequent pregnancies, incisional problems, thromboembolic phenomenon and other postoperative/anesthesia complications.   The patient concurred with the proposed plan, giving informed written consent for the procedure.    FINDINGS:  Viable female infant in cephalic presentation.  Apgars pending.  Clear amniotic fluid.  Intact placenta, three vessel cord.  Normal uterus, fallopian tubes and ovaries bilaterally. pH  7.1  PROCEDURE IN DETAIL:  The patient preoperatively received intravenous antibiotics and had sequential compression devices applied to her lower extremities.  She was then taken to the operating room where spinal anesthesia was administered and was found to be adequate. She was then placed in a dorsal supine position with a leftward tilt, and prepped and draped in a sterile manner.  A foley catheter was placed into her bladder and attached to constant gravity.  After an adequate timeout was performed, a Pfannenstiel skin incision was made with scalpel and carried through to the underlying layer of fascia. The fascia was incised in the midline, and this incision was extended bilaterally using the Mayo scissors.  Kocher clamps were applied to the superior aspect of the fascial incision and the underlying rectus muscles were dissected off bluntly. A similar process was carried out on the inferior aspect of the fascial incision. The rectus muscles were separated in the midline bluntly and the peritoneum was entered bluntly.  Attention was turned to the lower uterine segment where a low transverse hysterotomy incision was made with a scalpel and extended bilaterally bluntly.  The infant was successfully delivered, the cord was clamped and cut and the infant was handed over to awaiting neonatology team. The placenta was delivered manually. Uterine massage was then administered.  The placenta was intact with a three-vessel cord. The uterus was then cleared of clot and debris.  The hysterotomy was closed with 0 Vicryl in a running locked fashion, and an imbricating layer was also placed with the same suture. The pelvis was cleared of all clot and debris. Hemostasis was confirmed on all surfaces.  The peritoneum and the muscles were reapproximated using 0 Vicryl with 1  interrupted suture. The fascia was then closed using 0 Vicryl.  The subcutaneous layer was irrigated, then reapproximated with 3-0 vicryl in a running  locked fashion, and the skin was closed with a 4-0 Vicryl subcuticular stitch.  20 cc of 0.5% marcaine was injected into the incision and benzoin and steristrips were applied.  The patient tolerated the procedure well. Sponge, lap, instrument and needle counts were correct x 2.  She was taken to the recovery room in stable condition.   Isabel Weber, M.D., [facility]

## 2015-12-15 NOTE — Progress Notes (Signed)
Notified cord PH 7.10, Fran Cresenzo-Dishmon CNM notifed.

## 2015-12-15 NOTE — Brief Op Note (Signed)
12/15/2015  11:46 PM  PATIENT:  Isabel Weber  30 y.o. female  PRE-OPERATIVE DIAGNOSIS: polyhydramnios; fetal distress   POST-OPERATIVE DIAGNOSIS:  polyhydramnios; fetal distress   PROCEDURE:  Procedure(s): CESAREAN SECTION (N/A)  SURGEON:  Surgeon(s) and Role:    * Willodean Rosenthal, MD - Primary  ANESTHESIA:   epidural  EBL:  Total I/O In: 1800 [I.V.:1800] Out: 1125 [Urine:450; Blood:675]  BLOOD ADMINISTERED:none  DRAINS: none   LOCAL MEDICATIONS USED:  MARCAINE     SPECIMEN:  Source of Specimen:  placenta  DISPOSITION OF SPECIMEN:  PATHOLOGY  COUNTS:  YES  TOURNIQUET:  * No tourniquets in log *  DICTATION: .Note written in EPIC  PLAN OF CARE: Admit to inpatient   PATIENT DISPOSITION:  PACU - hemodynamically stable.   Delay start of Pharmacological VTE agent (>24hrs) due to surgical blood loss or risk of bleeding: yes  Complications: none immediate

## 2015-12-15 NOTE — Transfer of Care (Signed)
Immediate Anesthesia Transfer of Care Note  Patient: Isabel Weber  Procedure(s) Performed: Procedure(s): CESAREAN SECTION (N/A)  Patient Location: PACU  Anesthesia Type:Epidural  Level of Consciousness: awake, alert  and oriented  Airway & Oxygen Therapy: Patient Spontanous Breathing  Post-op Assessment: Report given to RN and Post -op Vital signs reviewed and stable  Post vital signs: Reviewed and stable  Last Vitals:  Filed Vitals:   12/15/15 2208 12/15/15 2232  BP: 123/59 112/69  Pulse: 86 90  Temp:    Resp: 18     Complications: No apparent anesthesia complications

## 2015-12-15 NOTE — Progress Notes (Addendum)
Patient ID: Isabel Weber, female   DOB: 12-07-1985, 30 y.o.   MRN: [id number] Pt with recurrent late decelerations. Not improved despite amnioinfusion. Cat III tracing.  Pt with h/o polyhydramnios.  The risks of cesarean section discussed with the patient included but were not limited to: bleeding which may require transfusion or reoperation; infection which may require antibiotics; injury to bowel, bladder, ureters or other surrounding organs; injury to the fetus; need for additional procedures including hysterectomy in the event of a life-threatening hemorrhage; placental abnormalities wth subsequent pregnancies, incisional problems, thromboembolic phenomenon and other postoperative/anesthesia complications. All questions answered. The patient concurred with the proposed plan, giving informed written consent for the procedure.   Patient has been NPO. She will remain NPO for procedure. Anesthesia and OR aware. Preoperative prophylactic antibiotics and SCDs ordered on call to the OR.  To OR when ready.  [place] Karl L. Harraway-Smith, M.D., [facility]

## 2015-12-15 NOTE — Progress Notes (Signed)
   LEYLA SOLIZ is a 30 y.o. G2P0010 at [redacted]w[redacted]d  admitted for induction of labor due to Post dates..  Subjective: Feels like ctx aren't as frequent  Objective: Filed Vitals:   12/15/15 2010 12/15/15 2030 12/15/15 2040 12/15/15 2131  BP: 120/65 118/64 124/70 111/54  Pulse: 114 113 107 108  Temp:    98.9 F (37.2 C)  TempSrc:    Oral  Resp: Height:      Weight:      SpO2:          FHT:  FHR: 150 bpm, variability: moderate,  accelerations:  Present,  decelerations:  Present variable UC:   irregular, every 2-6 minutes SVE:   6/80/-2/-3 Pitocin @ 4 mu/min  Labs: Lab Results  Component Value Date   WBC 9.0 12/15/2015   HGB 11.7* 12/15/2015   HCT 34.2* 12/15/2015   MCV 87.2 12/15/2015   PLT 255 12/15/2015    Assessment / Plan: IOL for postdates, making some change  Labor: Progressing normally Fetal Wellbeing:  Category I and Category II Pain Control:  Epidural Anticipated MOD:  NSVD  CRESENZO-DISHMAN,Shamiah Kahler 12/15/2015, 10:01 PM

## 2015-12-15 NOTE — Anesthesia Procedure Notes (Signed)
Epidural Patient location during procedure: OB Start time: 12/15/2015 3:08 PM End time: 12/15/2015 3:12 PM  Staffing Anesthesiologist: Leilani Able Performed by: anesthesiologist   Preanesthetic Checklist Completed: patient identified, surgical consent, pre-op evaluation, timeout performed, IV checked, risks and benefits discussed and monitors and equipment checked  Epidural Patient position: sitting Prep: site prepped and draped and DuraPrep Patient monitoring: continuous pulse ox and blood pressure Approach: midline Location: L3-L4 Injection technique: LOR air  Needle:  Needle type: Tuohy  Needle gauge: 17 G Needle length: 9 cm and 9 Needle insertion depth: 7 cm Catheter type: closed end flexible Catheter size: 19 Gauge Catheter at skin depth: 12 cm Test dose: negative and Other  Assessment Sensory level: T9 Events: blood not aspirated, injection not painful, no injection resistance, negative IV test and no paresthesia  Additional Notes Reason for block:procedure for pain

## 2015-12-15 NOTE — H&P (Signed)
LABOR ADMISSION HISTORY AND PHYSICAL  Isabel Weber is a 30 y.o. female G1P0 with IUP at [redacted]w[redacted]d by 61w4dUS presenting for IOL for PD. She reports +FM,, No LOF, no VB, no blurry vision, headaches or peripheral edema, and RUQ pain. Reports rare contractions. She plans on breast feeding. She request POPs or combination OCP for birth control.  Dating: By [redacted]w[redacted]d --->  Estimated Date of Delivery: 12/07/15  Sono:    , no dysmorphic features but severely limited imaging, 291g, 30% EFW , normal anatomy, 2194g 77%EFW @ 41wks, cephalic presentation + fetal head hyperextended (almost brow presentation), 3360g, 50% EFW (measurements difficult to obtain due to position)    Prenatal History/Complications: Mild Polyhydramnios noted at 24 weeks (AFI 22.34) -> high normal  (AFI 20.18) GBS positive Obesity in pregnancy  LGA  [place] but  50% EFW (measurements difficult to obtain due to position)   Past Medical History: Past Medical History  Diagnosis Date  . Medical history non-contributory     Past Surgical History: Past Surgical History  Procedure Laterality Date  . No past surgeries      Obstetrical History: OB History    Gravida Para Term Preterm AB TAB SAB Ectopic Multiple Living   1               Social History: Social History   Social History  . Marital Status: Single    Spouse Name: N/A  . Number of Children: N/A  . Years of Education: N/A   Social History Main Topics  . Smoking status: Former Smoker -- 0.50 packs/day for 5 years    Types: Cigarettes  . Smokeless tobacco: Never Used  . Alcohol Use: No  . Drug Use: No  . Sexual Activity: Yes    Birth Control/ Protection: Abstinence   Other Topics Concern  . Not on file   Social History Narrative    Family History: Family History  Problem Relation Age of Onset  . Hypertension Mother   . Diabetes Paternal Uncle     Allergies: No Known Allergies  Prescriptions prior to admission   Medication Sig Dispense Refill Last Dose  . acetaminophen (TYLENOL) 500 MG tablet Take 1,000 mg by mouth every 6 (six) hours as needed for mild pain or moderate pain.   Taking  . Prenatal Vit-Fe Fumarate-FA (PRENATAL MULTIVITAMIN) TABS tablet Take 1 tablet by mouth daily at 12 noon.   Taking     Review of Systems   All systems reviewed and negative except as stated in HPI  LMP 02/21/2015 (LMP Unknown) General appearance: alert and cooperative Lungs: clear to auscultation bilaterally Heart: regular rate and rhythm Abdomen: soft, non-tender; bowel sounds normal Extremities: Homans sign is negative, no sign of DVT, edema  Fetal monitoringBaseline: 135 bpm, Variability: Good {> 6 bpm), Accelerations: Reactive and Decelerations: Absent Uterine activity: every 5-6     Prenatal labs: ABO, Rh: O/Positive/-- (06/07 0845) Antibody: Negative (06/07 0845) Rubella: Immune  RPR: Non Reactive (10/13 0838)  HBsAg: Negative (06/07 0845)  HIV: Non Reactive (10/13 0838)  GBS: Positive (12/21 0000)  1 hr Glucola 136; normal 3hr glucola  Genetic screening: NIPS nml Anatomy US: normal- mild polyhydramnios noted at 24 weeks-resolved   Prenatal Transfer Tool  Maternal Diabetes: No; failed 1hr (136) but passed 3hr screen  Genetic Screening: Normal NIPS nml Maternal Ultrasounds/Referrals: Normal anatomy; mild polyhydramnios noted at 24 weeks-resolved 32w Fetal Ultrasounds or other Referrals:  None Maternal Substance Abuse:  No Significant Maternal Medications:  None  Significant Maternal Lab Results: Lab values include: Group B Strep positive  No results found for this or any previous visit (from the past 24 hour(s)).  Patient Active Problem List   Diagnosis Date Noted  . Obesity in pregnancy 12/13/2015  . Group B Streptococcus carrier, +RV culture, currently pregnant 11/11/2015  . 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 Supervision of normal pregnancy  05/04/2015    Assessment: Isabel Weber is a 30 y.o. G1P0 at [redacted]w[redacted]d here for IOL for postdates.   #Labor: IOL likely with cytotec (cervical exam from 1/24: fingertip/thick) #Pain: Patient desires epidural #FWB: Cat 1 #ID: GBS positive: pcn ordered #MOF: breast #MOC: POPs vs combination OCP #Circ: inpatient  Palma Holter, MD PGY 1 Family Medicine    CNM attestation:  I have seen and examined this patient; I agree with above documentation in the resident's note.   Isabel Weber is a 30 y.o. G2P0010 here for IOL due to post term  PE: BP 138/74 mmHg  Pulse 89  Temp(Src) 99 F (37.2 C) (Oral)  Resp 18  Ht  (1.575 m)  Wt 105.235 kg (232 lb)  BMI 42.42 kg/m2  LMP 02/21/2015 (LMP Unknown) Gen: calm comfortable, NAD Resp: normal effort, no distress Abd: gravid  ROS, labs, PMH reviewed  Plan: IUP@41wks  Unfavorable cx Mild poly GBS pos  Admit to [facility] PCN w/ active labor/ROM Plan cx ripening w/ cytotec followed by foley bulb/Pit Pt and family questions answered   Cam Hai CNM 12/15/2015, 9:35 AM

## 2015-12-15 NOTE — Anesthesia Preprocedure Evaluation (Signed)
Anesthesia Evaluation  Patient identified by MRN, date of birth, ID band Patient awake    Reviewed: Allergy & Precautions, H&P , NPO status , Patient's Chart, lab work & pertinent test results  Airway Mallampati: I  TM Distance: >3 FB Neck ROM: full    Dental no notable dental hx.    Pulmonary former smoker,    Pulmonary exam normal        Cardiovascular negative cardio ROS Normal cardiovascular exam     Neuro/Psych negative neurological ROS  negative psych ROS   GI/Hepatic negative GI ROS, Neg liver ROS,   Endo/Other  Morbid obesity  Renal/GU negative Renal ROS     Musculoskeletal   Abdominal (+) + obese,   Peds  Hematology negative hematology ROS (+)   Anesthesia Other Findings   Reproductive/Obstetrics (+) Pregnancy                             Anesthesia Physical Anesthesia Plan  ASA: III  Anesthesia Plan: Epidural   Post-op Pain Management:    Induction:   Airway Management Planned:   Additional Equipment:   Intra-op Plan:   Post-operative Plan:   Informed Consent: I have reviewed the patients History and Physical, chart, labs and discussed the procedure including the risks, benefits and alternatives for the proposed anesthesia with the patient or authorized representative who has indicated his/her understanding and acceptance.     Plan Discussed with:   Anesthesia Plan Comments:         Anesthesia Quick Evaluation

## 2015-12-16 ENCOUNTER — Encounter (HOSPITAL_COMMUNITY): Payer: Self-pay

## 2015-12-16 LAB — RPR: RPR Ser Ql: NONREACTIVE

## 2015-12-16 LAB — CBC
HEMATOCRIT: 30 % — AB (ref 36.0–46.0)
HEMOGLOBIN: 10 g/dL — AB (ref 12.0–15.0)
MCH: 29.2 pg (ref 26.0–34.0)
MCHC: 33.3 g/dL (ref 30.0–36.0)
MCV: 87.7 fL (ref 78.0–100.0)
Platelets: 252 10*3/uL (ref 150–400)
RBC: 3.42 MIL/uL — AB (ref 3.87–5.11)
RDW: 14.4 % (ref 11.5–15.5)
WBC: 11.1 10*3/uL — AB (ref 4.0–10.5)

## 2015-12-16 MED ORDER — SIMETHICONE 80 MG PO CHEW
80.0000 mg | CHEWABLE_TABLET | Freq: Three times a day (TID) | ORAL | Status: DC
  Administered 2015-12-16 (×3): 80 mg via ORAL
  Filled 2015-12-16 (×2): qty 1

## 2015-12-16 MED ORDER — ACETAMINOPHEN 325 MG PO TABS: 650.0000 mg | ORAL_TABLET | ORAL | Status: DC | PRN

## 2015-12-16 MED ORDER — DIPHENHYDRAMINE HCL 50 MG/ML IJ SOLN: 12.5000 mg | INTRAMUSCULAR | Status: DC | PRN

## 2015-12-16 MED ORDER — SIMETHICONE 80 MG PO CHEW
80.0000 mg | CHEWABLE_TABLET | ORAL | Status: DC
  Administered 2015-12-16: 80 mg via ORAL
  Filled 2015-12-16: qty 1

## 2015-12-16 MED ORDER — NALOXONE HCL 0.4 MG/ML IJ SOLN: 0.4000 mg | INTRAMUSCULAR | Status: DC | PRN

## 2015-12-16 MED ORDER — LACTATED RINGERS IV SOLN: INTRAVENOUS | Status: DC

## 2015-12-16 MED ORDER — OXYTOCIN 10 UNIT/ML IJ SOLN: 2.5000 [IU]/h | INTRAVENOUS | Status: AC

## 2015-12-16 MED ORDER — FENTANYL CITRATE (PF) 100 MCG/2ML IJ SOLN: 25.0000 ug | INTRAMUSCULAR | Status: DC | PRN

## 2015-12-16 MED ORDER — DIPHENHYDRAMINE HCL 25 MG PO CAPS: 25.0000 mg | ORAL_CAPSULE | Freq: Four times a day (QID) | ORAL | Status: DC | PRN

## 2015-12-16 MED ORDER — PRENATAL MULTIVITAMIN CH
1.0000 | ORAL_TABLET | Freq: Every day | ORAL | Status: DC
  Administered 2015-12-16 – 2015-12-17 (×2): 1 via ORAL
  Filled 2015-12-16 (×2): qty 1

## 2015-12-16 MED ORDER — NALOXONE HCL 2 MG/2ML IJ SOSY
1.0000 ug/kg/h | PREFILLED_SYRINGE | INTRAVENOUS | Status: DC | PRN
  Filled 2015-12-16: qty 2

## 2015-12-16 MED ORDER — MENTHOL 3 MG MT LOZG: 1.0000 | LOZENGE | OROMUCOSAL | Status: DC | PRN

## 2015-12-16 MED ORDER — KETOROLAC TROMETHAMINE 30 MG/ML IJ SOLN: 30.0000 mg | Freq: Four times a day (QID) | INTRAMUSCULAR | Status: AC | PRN

## 2015-12-16 MED ORDER — NALBUPHINE HCL 10 MG/ML IJ SOLN: 5.0000 mg | INTRAMUSCULAR | Status: DC | PRN

## 2015-12-16 MED ORDER — LANOLIN HYDROUS EX OINT: 1.0000 "application " | TOPICAL_OINTMENT | CUTANEOUS | Status: DC | PRN

## 2015-12-16 MED ORDER — SENNOSIDES-DOCUSATE SODIUM 8.6-50 MG PO TABS
2.0000 | ORAL_TABLET | ORAL | Status: DC
  Administered 2015-12-16 – 2015-12-17 (×2): 2 via ORAL
  Filled 2015-12-16 (×2): qty 2

## 2015-12-16 MED ORDER — SODIUM CHLORIDE 0.9% FLUSH: 3.0000 mL | INTRAVENOUS | Status: DC | PRN

## 2015-12-16 MED ORDER — NALBUPHINE HCL 10 MG/ML IJ SOLN: 5.0000 mg | Freq: Once | INTRAMUSCULAR | Status: DC | PRN

## 2015-12-16 MED ORDER — WITCH HAZEL-GLYCERIN EX PADS: 1.0000 "application " | MEDICATED_PAD | CUTANEOUS | Status: DC | PRN

## 2015-12-16 MED ORDER — PROMETHAZINE HCL 25 MG/ML IJ SOLN: 6.2500 mg | INTRAMUSCULAR | Status: DC | PRN

## 2015-12-16 MED ORDER — OXYCODONE-ACETAMINOPHEN 5-325 MG PO TABS
2.0000 | ORAL_TABLET | ORAL | Status: DC | PRN
  Administered 2015-12-17: 2 via ORAL
  Filled 2015-12-16: qty 2

## 2015-12-16 MED ORDER — MEPERIDINE HCL 25 MG/ML IJ SOLN: 6.2500 mg | INTRAMUSCULAR | Status: DC | PRN

## 2015-12-16 MED ORDER — DIBUCAINE 1 % RE OINT: 1.0000 "application " | TOPICAL_OINTMENT | RECTAL | Status: DC | PRN

## 2015-12-16 MED ORDER — OXYCODONE-ACETAMINOPHEN 5-325 MG PO TABS: 1.0000 | ORAL_TABLET | ORAL | Status: DC | PRN

## 2015-12-16 MED ORDER — BUPIVACAINE HCL (PF) 0.5 % IJ SOLN
INTRAMUSCULAR | Status: DC | PRN
  Administered 2015-12-15: 20 mL

## 2015-12-16 MED ORDER — SIMETHICONE 80 MG PO CHEW
80.0000 mg | CHEWABLE_TABLET | ORAL | Status: DC | PRN
  Administered 2015-12-16: 80 mg via ORAL
  Filled 2015-12-16: qty 1

## 2015-12-16 MED ORDER — MEASLES, MUMPS & RUBELLA VAC ~~LOC~~ INJ
0.5000 mL | INJECTION | Freq: Once | SUBCUTANEOUS | Status: DC
  Filled 2015-12-16: qty 0.5

## 2015-12-16 MED ORDER — TETANUS-DIPHTH-ACELL PERTUSSIS 5-2.5-18.5 LF-MCG/0.5 IM SUSP: 0.5000 mL | Freq: Once | INTRAMUSCULAR | Status: DC

## 2015-12-16 MED ORDER — DIPHENHYDRAMINE HCL 25 MG PO CAPS: 25.0000 mg | ORAL_CAPSULE | ORAL | Status: DC | PRN

## 2015-12-16 MED ORDER — ZOLPIDEM TARTRATE 5 MG PO TABS: 5.0000 mg | ORAL_TABLET | Freq: Every evening | ORAL | Status: DC | PRN

## 2015-12-16 MED ORDER — IBUPROFEN 600 MG PO TABS
600.0000 mg | ORAL_TABLET | Freq: Four times a day (QID) | ORAL | Status: DC
  Administered 2015-12-16 – 2015-12-17 (×6): 600 mg via ORAL
  Filled 2015-12-16 (×6): qty 1

## 2015-12-16 MED ORDER — SCOPOLAMINE 1 MG/3DAYS TD PT72
1.0000 | MEDICATED_PATCH | Freq: Once | TRANSDERMAL | Status: DC
  Filled 2015-12-16: qty 1

## 2015-12-16 MED ORDER — ONDANSETRON HCL 4 MG/2ML IJ SOLN
4.0000 mg | Freq: Three times a day (TID) | INTRAMUSCULAR | Status: DC | PRN
  Administered 2015-12-16: 4 mg via INTRAVENOUS
  Filled 2015-12-16: qty 2

## 2015-12-16 NOTE — Anesthesia Postprocedure Evaluation (Signed)
Anesthesia Post Note  Patient: Isabel Weber  Procedure(s) Performed: Procedure(s) (LRB): CESAREAN SECTION (N/A)  Patient location during evaluation: PACU Anesthesia Type: Epidural Level of consciousness: oriented and awake and alert Pain management: pain level controlled Vital Signs Assessment: post-procedure vital signs reviewed and stable Respiratory status: spontaneous breathing, respiratory function stable and patient connected to nasal cannula oxygen Cardiovascular status: blood pressure returned to baseline and stable Postop Assessment: no headache, no backache and epidural receding Anesthetic complications: no    Last Vitals:  Filed Vitals:   12/16/15 0100 12/16/15 0145  BP: 140/71 134/69  Pulse: 100 103  Temp:  36.9 C  Resp: 30 20    Last Pain:  Filed Vitals:   12/16/15 0146  PainSc: Asleep                 Shelton Silvas

## 2015-12-16 NOTE — Anesthesia Postprocedure Evaluation (Signed)
Anesthesia Post Note  Patient: Isabel Weber  Procedure(s) Performed: Procedure(s) (LRB): CESAREAN SECTION (N/A)  Patient location during evaluation: [facility] Anesthesia Type: Spinal Level of consciousness: awake and alert and oriented Pain management: pain level controlled Vital Signs Assessment: post-procedure vital signs reviewed and stable Respiratory status: spontaneous breathing Cardiovascular status: blood pressure returned to baseline Postop Assessment: no headache, no backache, no signs of nausea or vomiting, adequate PO intake and patient able to bend at knees Anesthetic complications: no    Last Vitals:  Filed Vitals:   12/16/15 0344 12/16/15 0500  BP: 122/65 127/62  Pulse: 97 88  Temp: 37.2 C 37.1 C  Resp: 20 20    Last Pain:  Filed Vitals:   12/16/15 0657  PainSc: 3                  Broden Holt

## 2015-12-16 NOTE — Lactation Note (Addendum)
This note was copied from the chart of Isabel Ladaisha Portillo. Lactation Consultation Note  Patient Name: Isabel Weber [id number] Date: 12/16/2015 Reason for consult: Initial assessment   Initial consult with first time mom of 10 hour old infant. Infant was cueing to feed and sucking on a pacifier. Enc mom to wait on pacifier for first few weeks and to place infant to breast with feeding cues. Infant with 1 BF for 25 minutes, 3 attempts, 4 stools and 2 spit ups. Unwrapped infant and placed STS, Assisted mom to place infant to left breast in cross cradle hold. Infant fussy and noted to have mucous in throat that he was trying to swallow. Burped him and placed him to right breast in football hold. Infant noted to be sucking tongue and would not open wide. Worked with him and infant did latch to right breast in football hold. Lips flanged and rhythmic sucking noted. Infant with a few intermittent swallows. Infant continued nursing when I left room. Reviewed that infant needs to be fed 8-12 x in 24 hours at first feeding cues for at least 15-20 minutes, Stomach size, colostrum, NB Feeding behaviors, cluster feeding, I/O, nipple care, hand expression, STS, awakening techniques, BF basics, feeding cues, Nutritional needs of NB, and positioning. Mom was drowsy with infant feeding, GM at bedside to assist mom. Gave mom BF Resources handout, Feeding Log, and LC Brochure. Reviewed IP/OP Services, BF Support Groups and LC Phone #. Enc family to call out to desk for assistance with latch. Mom was asking about pumping and giving infant bottle of EBM. Discussed that the volume of colostrum and pumping early on usually dies not yield large volumes for bottle feeding but that colostrum is adequate volume for NB infant. Mom with small wide spaced breasts that are cone shaped. She reports minimal changes with pregnancy with some changes towards end of pregnancy nipples are med shafted and everted, breast tissue/areola is easily  compressible. Saw glistening with hand expression on both sides. Mom has a personal DEBP at bedside to use if needed. Report to Thayer Ohm, Charity fundraiser. Will follow up tomorrow.   Maternal Data Formula Feeding for Exclusion: No Has patient been taught Hand Expression?: Yes Does the patient have breastfeeding experience prior to this delivery?: No  Feeding Feeding Type: Breast Fed Length of feed: 10 min (Still feeding when I left the room)  LATCH Score/Interventions Latch: Grasps breast easily, tongue down, lips flanged, rhythmical sucking. (Does not open mouth wide) Intervention(s): Skin to skin;Teach feeding cues;Waking techniques Intervention(s): Assist with latch;Adjust position;Breast compression  Audible Swallowing: A few with stimulation  Type of Nipple: Everted at rest and after stimulation  Comfort (Breast/Nipple): Soft / non-tender     Hold (Positioning): Assistance needed to correctly position infant at breast and maintain latch. Intervention(s): Breastfeeding basics reviewed;Support Pillows;Position options;Skin to skin  LATCH Score: 8  Lactation Tools Discussed/Used WIC Program: No   Consult Status Consult Status: Follow-up Date: 12/17/15 Follow-up type: In-patient    Silas Flood Kema Santaella 12/16/2015, 9:12 AM

## 2015-12-16 NOTE — Addendum Note (Signed)
Addendum  created 12/16/15 1003 by Jhonnie Garner, CRNA   Modules edited: Clinical Notes   Clinical Notes:  File: [id number]

## 2015-12-17 DIAGNOSIS — Z98891 History of uterine scar from previous surgery: Secondary | ICD-10-CM

## 2015-12-17 MED ORDER — OXYCODONE-ACETAMINOPHEN 5-325 MG PO TABS: 1.0000 | ORAL_TABLET | ORAL | Status: DC | PRN

## 2015-12-17 NOTE — Discharge Instructions (Signed)
Cesarean Delivery, Care After  Refer to this sheet in the next few weeks. These instructions provide you with information on caring for yourself after your procedure. Your health care provider may also give you specific instructions. Your treatment has been planned according to current medical practices, but problems sometimes occur. Call your health care provider if you have any problems or questions after you go home.  HOME CARE INSTRUCTIONS   Only take over-the-counter or prescription medications as directed by your health care provider.   Do not drink alcohol, especially if you are breastfeeding or taking medication to relieve pain.   Do not chew or smoke tobacco.   Continue to use good perineal care. Good perineal care includes:    Wiping your perineum from front to back.    Keeping your perineum clean.   Check your surgical cut (incision) daily for increased redness, drainage, swelling, or separation of skin.   Clean your incision gently with soap and water every day, and then pat it dry. If your health care provider says it is okay, leave the incision uncovered. Use a bandage (dressing) if the incision is draining fluid or appears irritated. If the adhesive strips across the incision do not fall off within 7 days, carefully peel them off.   Hug a pillow when coughing or sneezing until your incision is healed. This helps to relieve pain.   Do not use tampons or douche until your health care provider says it is okay.   Shower, wash your hair, and take tub baths as directed by your health care provider.   Wear a well-fitting bra that provides breast support.   Limit wearing support panties or control-top hose.   Drink enough fluids to keep your urine clear or pale yellow.   Eat high-fiber foods such as whole grain cereals and breads, brown rice, beans, and fresh fruits and vegetables every day. These foods may help prevent or relieve constipation.   Resume activities such as climbing stairs,  driving, lifting, exercising, or traveling as directed by your health care provider.   Talk to your health care provider about resuming sexual activities. This is dependent upon your risk of infection, your rate of healing, and your comfort and desire to resume sexual activity.   Try to have someone help you with your household activities and your newborn for at least a few days after you leave the hospital.   Rest as much as possible. Try to rest or take a nap when your newborn is sleeping.   Increase your activities gradually.   Keep all of your scheduled postpartum appointments. It is very important to keep your scheduled follow-up appointments. At these appointments, your health care provider will be checking to make sure that you are healing physically and emotionally.  SEEK MEDICAL CARE IF:    You are passing large clots from your vagina. Save any clots to show your health care provider.   You have a foul smelling discharge from your vagina.   You have trouble urinating.   You are urinating frequently.   You have pain when you urinate.   You have a change in your bowel movements.   You have increasing redness, pain, or swelling near your incision.   You have pus draining from your incision.   Your incision is separating.   You have painful, hard, or reddened breasts.   You have a severe headache.   You have blurred vision or see spots.   You feel sad   or depressed.   You have thoughts of hurting yourself or your newborn.   You have questions about your care, the care of your newborn, or medications.   You are dizzy or light-headed.   You have a rash.   You have pain, redness, or swelling at the site of the removed intravenous access (IV) tube.   You have nausea or vomiting.   You stopped breastfeeding and have not had a menstrual period within 12 weeks of stopping.   You are not breastfeeding and have not had a menstrual period within 12 weeks of delivery.   You have a fever.  SEEK  IMMEDIATE MEDICAL CARE IF:   You have persistent pain.   You have chest pain.   You have shortness of breath.   You faint.   You have leg pain.   You have stomach pain.   Your vaginal bleeding saturates 2 or more sanitary pads in 1 hour.  MAKE SURE YOU:    Understand these instructions.   Will watch your condition.   Will get help right away if you are not doing well or get worse.     This information is not intended to replace advice given to you by your health care provider. Make sure you discuss any questions you have with your health care provider.     Document Released: 07/28/2002 Document Revised: 11/26/2014 Document Reviewed: 07/02/2012  Elsevier Interactive Patient Education 2016 [employer]

## 2015-12-17 NOTE — Discharge Summary (Signed)
OB Discharge Summary     Patient Name: Isabel Weber DOB: 1986-01-18 MRN: [id number]  Date of admission: 12/15/2015 Delivering MD: Isabel Weber   Date of discharge: 12/17/2015  Admitting diagnosis: INDUCTION Intrauterine pregnancy: [redacted]w[redacted]d     Secondary diagnosis:  Active Problems:   Status post cesarean section  Additional problems: non reassuring fetal heart tracing     Discharge diagnosis: Term Pregnancy Delivered                                                                                                Post partum procedures: none  Augmentation: AROM  Complications: None  Hospital course:  Induction of Labor With Cesarean Section  30 y.o. yo G2P1011 at [redacted]w[redacted]d was admitted to the hospital 12/15/2015 for induction of labor. Patient had a labor course significant for nonreassuring fetal heart tracing. The patient went for cesarean section due to Non-Reassuring FHR, and delivered a Viable infant, 8 pounds 8 ounces. Membrane Rupture Time/Date: 3:49 PM ,12/15/2015   Details of operation can be found in separate operative Note.  Patient had an uncomplicated postpartum course. She is ambulating, tolerating a regular diet, passing flatus, and urinating well.  Patient is discharged home in stable condition on 12/17/2015.                                     Physical exam  Filed Vitals:   12/16/15 1400 12/16/15 1829 12/16/15 2224 12/17/15 0616  BP: 122/56 112/55 120/60 114/59  Pulse: 105 96 94 94  Temp: 98.9 F (37.2 C) 99.5 F (37.5 C) 99.7 F (37.6 C) 98.2 F (36.8 C)  TempSrc: Oral Oral  Oral  Resp: Height:      Weight:      SpO2: 100% 99% 100% 97%   General: alert, cooperative and no distress Lochia: appropriate Uterine Fundus: firm Incision: Healing well with no significant drainage, No significant erythema, Dressing is clean, dry, and intact DVT Evaluation: No evidence of DVT seen on physical exam. Negative Homan's sign. Labs: Lab Results   Component Value Date   WBC 11.1* 12/16/2015   HGB 10.0* 12/16/2015   HCT 30.0* 12/16/2015   MCV 87.7 12/16/2015   PLT 252 12/16/2015   CMP Latest Ref Rng 05/24/2015  Glucose 65 - 99 mg/dL 82  BUN 6 - 20 mg/dL 11  Creatinine 4.09 - 8.11 mg/dL 9.14  Sodium 782 - 956 mmol/L 134(L)  Potassium 3.5 - 5.1 mmol/L 3.7  Chloride 101 - 111 mmol/L 106  CO2 22 - 32 mmol/L 20(L)  Calcium 8.9 - 10.3 mg/dL 2.1(H)  Total Protein 6.5 - 8.1 g/dL -  Total Bilirubin 0.3 - 1.2 mg/dL -  Alkaline Phos 38 - 086 U/L -  AST 15 - 41 U/L -  ALT 14 - 54 U/L -    Discharge instruction: per After Visit Summary and "Baby and Me Booklet".  After visit meds:    Medication List    TAKE these medications  acetaminophen 500 MG tablet  Commonly known as:  TYLENOL  Take 1,000 mg by mouth every 6 (six) hours as needed for mild pain or moderate pain.     calcium carbonate 500 MG chewable tablet  Commonly known as:  TUMS - dosed in mg elemental calcium  Chew 2 tablets by mouth 3 (three) times daily with meals as needed for indigestion or heartburn.     oxyCODONE-acetaminophen 5-325 MG tablet  Commonly known as:  PERCOCET/ROXICET  Take 1-2 tablets by mouth every 4 (four) hours as needed (pain scale 4-7).     prenatal multivitamin Tabs tablet  Take 1 tablet by mouth daily at 12 noon.        Diet: routine diet  Activity: Advance as tolerated. Pelvic rest for 6 weeks.   Outpatient follow up:6 weeks Follow up Appt:Future Appointments Date Time Provider Department Center  01/24/2016 1:30 PM Isabel Rosenthal, MD [facility]   Follow up Visit:No Follow-up on file.  Postpartum contraception: IUD Mirena  Newborn Data: Live born female  Birth Weight: 8 lb 8 oz (3855 g) APGAR: 2, 9  Baby Feeding: Breast Disposition:home with mother   12/17/2015 Isabel Celeste JEHIEL, DO

## 2015-12-17 NOTE — Plan of Care (Signed)
Problem: Activity: Goal: Ability to tolerate increased activity will improve Outcome: Completed/Met Date Met:  12/17/15 Tolerates ambulating in hall well.  Problem: Life Cycle: Goal: Risk for postpartum hemorrhage will decrease Outcome: Completed/Met Date Met:  12/17/15 Vaginal bleeding wnl at this time.  Problem: Pain Management: Goal: General experience of comfort will improve and pain level will decrease Outcome: Completed/Met Date Met:  12/17/15 Good pain control on po motrin,and percocet.  Problem: Bowel/Gastric: Goal: Gastrointestinal status will improve Outcome: Completed/Met Date Met:  12/17/15 Tolerates Regular diet.     

## 2015-12-17 NOTE — Progress Notes (Signed)
Post Partum Day 2 Subjective:  Isabel Weber is a 30 y.o. G2P1011 on POD#2, pLTCS for NRFHT at [redacted]w[redacted]d.  She has no complaints, is up ad lib, voiding and tolerating PO, small lochia, breastfeeding for 1hr 5x daily, IUD, voiding appropriately, minimal bleeding, and abdominal pain 4/10 at incision site.   Objective: Blood pressure 118/73, pulse 103, temperature 98.4 F (36.9 C), temperature source Oral, resp. rate 20, height  (1.575 m), weight 105.235 kg (232 lb), last menstrual period 02/21/2015, SpO2 98 %, unknown if currently breastfeeding.  Physical Exam:  General: alert, cooperative and no distress Lochia:normal flow Chest: CTAB Heart: RRR no m/r/g Abdomen: +BS, soft, nontender, dressing c/d/i with no erethema Uterine Fundus: firm, below the umbilicus  DVT Evaluation: No evidence of DVT seen on physical exam. Extremities: no edema   Recent Labs  12/15/15 0512 12/16/15 0527  HGB 11.7* 10.0*  HCT 34.2* 30.0*    Assessment/Plan: Uncomplicated postpartum course. Patient ambulating well, tolerating regular diet, having bowel movements and urinating.  Discharge home today Breastfeeding, she plans to attempt shorter more frequent feedings. Circumcision complete   LOS: 2 days   Everrett Coombe 12/17/2015, 12:56 PM

## 2015-12-17 NOTE — Progress Notes (Signed)
Discharge instructions reviewed with patient.  Patient states understanding of home care for herself and baby, medications, activity, signs/symptoms to report to MD and return MD office visit.  Patients significant other and family will assist with her care @ home.  No home  equipment needed, patient has prescriptions and all personal belongings.  Patient ambulated for discharge in stable condition with staff without incident.  Baby discharged home with mother.

## 2015-12-23 ENCOUNTER — Telehealth: Payer: Self-pay | Admitting: *Deleted

## 2015-12-23 ENCOUNTER — Other Ambulatory Visit: Payer: Self-pay | Admitting: Obstetrics & Gynecology

## 2015-12-23 DIAGNOSIS — Z98891 History of uterine scar from previous surgery: Secondary | ICD-10-CM

## 2015-12-23 DIAGNOSIS — G8918 Other acute postprocedural pain: Secondary | ICD-10-CM

## 2015-12-23 MED ORDER — OXYCODONE-ACETAMINOPHEN 5-325 MG PO TABS: 1.0000 | ORAL_TABLET | ORAL | Status: DC | PRN

## 2015-12-23 NOTE — Telephone Encounter (Signed)
Pt had C/S on 12-15-15, still experiencing discomfort in her abdomen, requesting refill on her pain medication.  Refill approved by Dr Macon Large, pt will pick up medication.

## 2015-12-23 NOTE — Progress Notes (Signed)
Patient called with continued severe pain, requested refill of Percocet.  She is s/p cesarean on 12/15/15.  Percocet refilled  #30.  No further narcotics needed. Advised to also take Ibuprofen as prescribed.   Jaynie Collins, MD, FACOG Attending Obstetrician & Gynecologist,  [facility]

## 2015-12-23 NOTE — Telephone Encounter (Signed)
-----   Message from Olevia Bowens sent at 12/23/2015 11:23 AM EST ----- Regarding: Questions Contact: [phone] Has a couple of questions about FMLA, and other things (wouldn't say) wants to speak with someone

## 2016-01-24 ENCOUNTER — Ambulatory Visit (INDEPENDENT_AMBULATORY_CARE_PROVIDER_SITE_OTHER): Payer: BLUE CROSS/BLUE SHIELD | Admitting: Obstetrics & Gynecology

## 2016-01-24 ENCOUNTER — Encounter: Payer: Self-pay | Admitting: Obstetrics & Gynecology

## 2016-01-24 DIAGNOSIS — Z3009 Encounter for other general counseling and advice on contraception: Secondary | ICD-10-CM

## 2016-01-24 DIAGNOSIS — Z30011 Encounter for initial prescription of contraceptive pills: Secondary | ICD-10-CM

## 2016-01-24 MED ORDER — NORETHINDRONE ACET-ETHINYL EST 1.5-30 MG-MCG PO TABS: 1.0000 | ORAL_TABLET | Freq: Every day | ORAL | Status: AC

## 2016-01-24 NOTE — Progress Notes (Signed)
Patient ID: Isabel Weber, female   DOB: 12/24/1985, 30 y.o.   MRN: [id number] Subjective:     Isabel Weber is a 30 y.o. female who presents for a postpartum visit. G2P1011She is 6 weeks postpartum following a low cervical transverse Cesarean section. I have fully reviewed the prenatal and intrapartum course. The delivery was at 4 gestational weeks. Outcome: primary cesarean section, low transverse incision. Anesthesia: spinal. Postpartum course has been without problems. Baby's course has been uncomplicated. Baby is feeding by bottle. Bleeding no bleeding. Bowel function is normal. Bladder function is normal. Patient is not sexually active. Contraception method is OCP (estrogen/progesterone). Postpartum depression screening: negative.  The following portions of the patient's history were reviewed and updated as appropriate: allergies, current medications, past family history, past medical history, past social history, past surgical history and problem list.  Review of Systems Pertinent items are noted in HPI.   Objective:    BP 123/81 mmHg  Pulse 76  Ht 5\' 2"  (1.575 m)  Wt 203 lb (92.08 kg)  BMI 37.12 kg/m2  LMP 01/16/2016  Breastfeeding? No       Pt in NAD Abd: soft, NT, ND.  Incision: well healed    Assessment:     6 weeks postpartum exam. Pap smear not done at today's visit.   contraception counseling  Plan:    1. Contraception: OCP (estrogen/progesterone)- LoEstrin 1.5/30 1 po q day 2.  Follow up in: 3 months for PAP or as needed.   3. Gradual return to full activity   Bodee Lafoe L. Harraway-Smith, M.D., Evern CoreFACOG

## 2016-01-24 NOTE — Progress Notes (Signed)
Patient ID: Isabel SaxonJessica N Mcquire, female   DOB: 03/18/1986, 30 y.o.   MRN: [id number] Here today for postpartum visit.  Has not had sex yet.  [facility]

## 2016-04-04 IMAGING — US US MFM FETAL BPP W/O NON-STRESS
1 series · 12 of 18 positions shown · non-contrast
Comparison: none

[Series 1: us mfm fetal bpp w/o non-stress · 18 acquisitions, 12 frames shown]
[im 1/18]
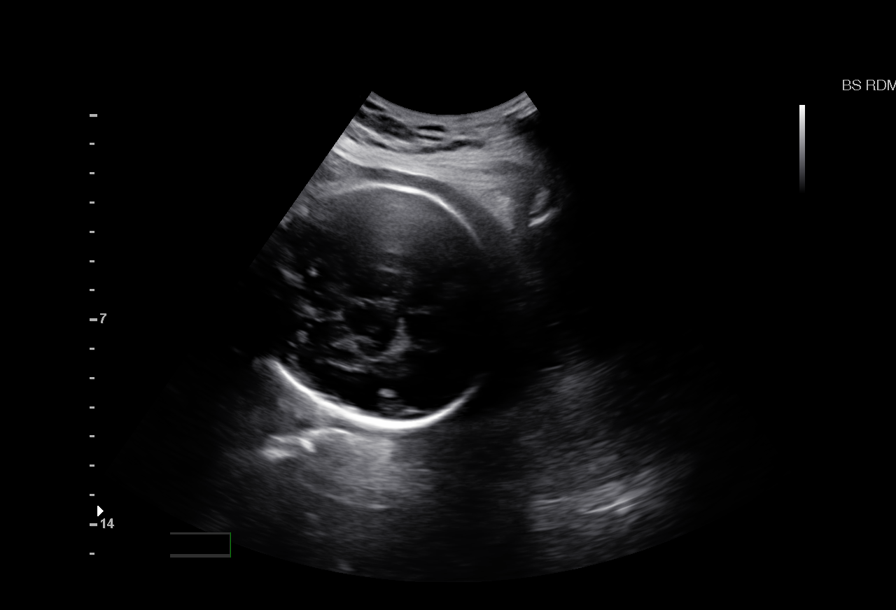
[im 3/18]
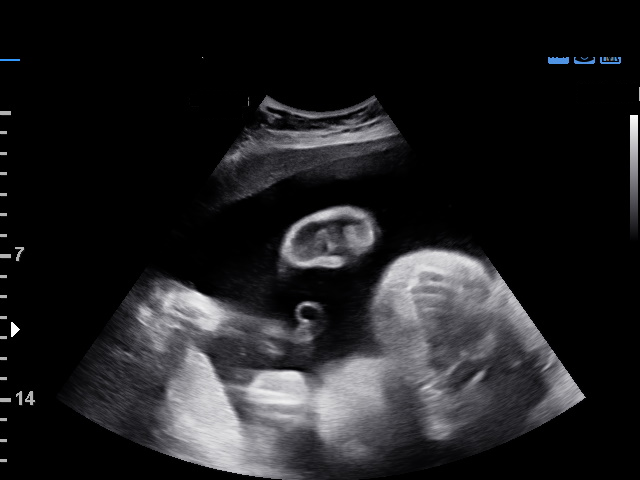
[im 4/18]
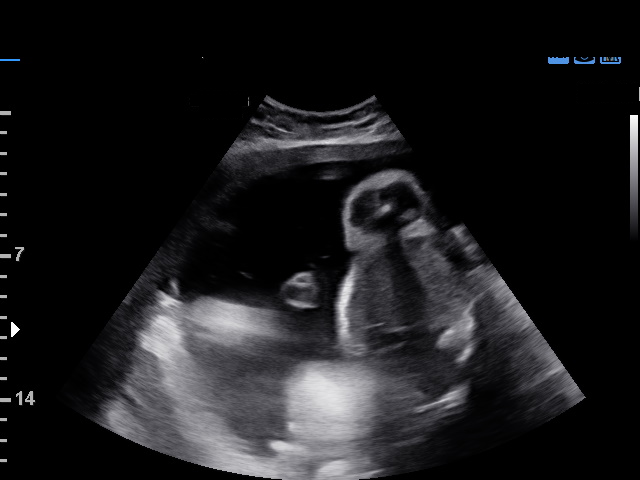
[im 6/18]
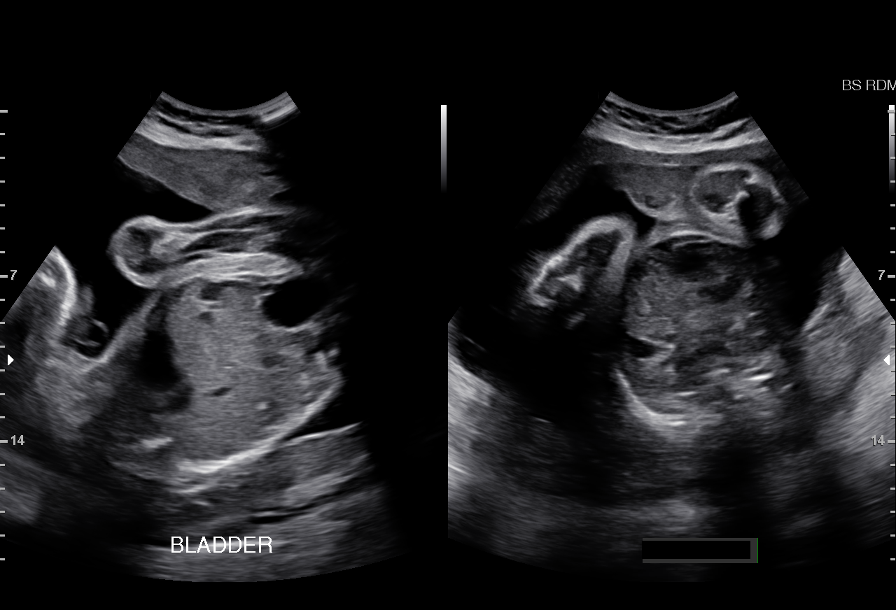
[im 7/18]
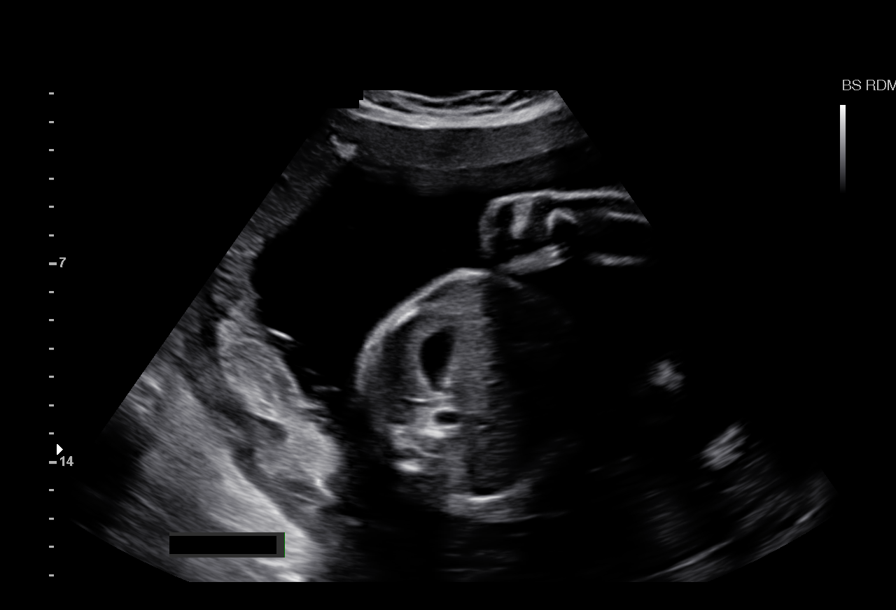
[im 9/18]
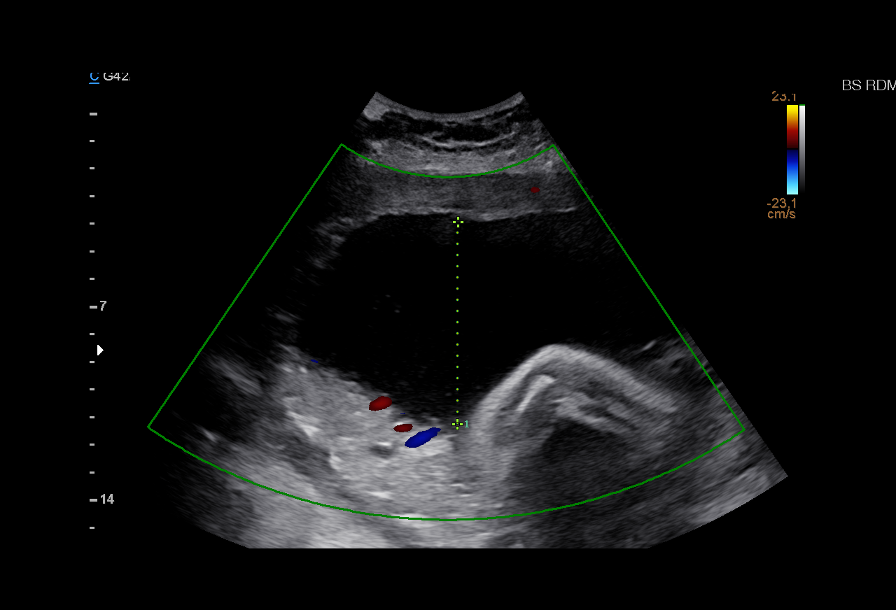
[im 10/18]
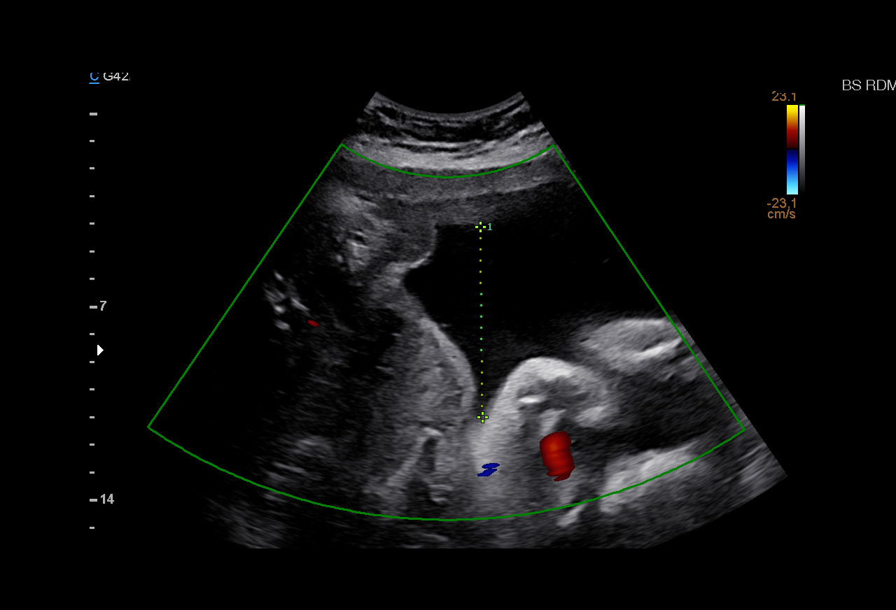
[im 12/18]
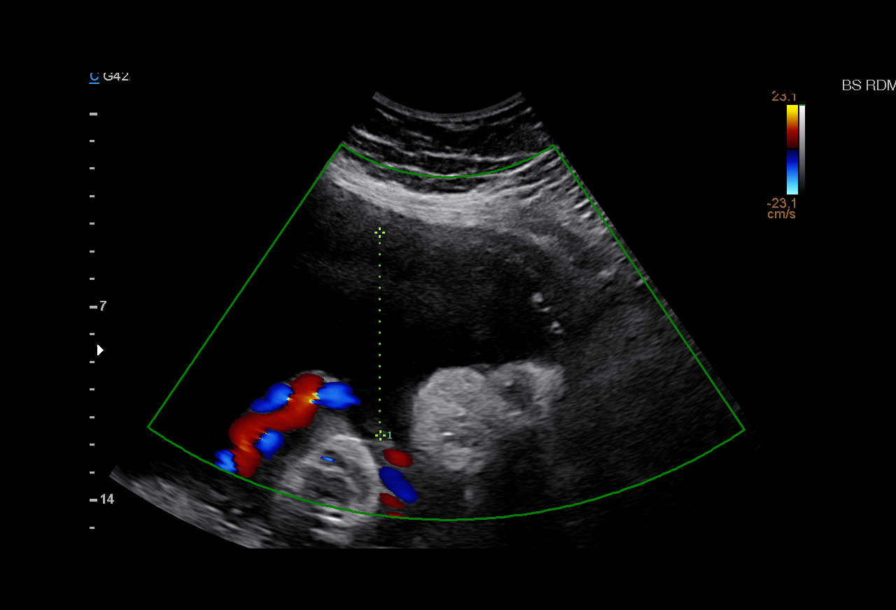
[im 13/18]
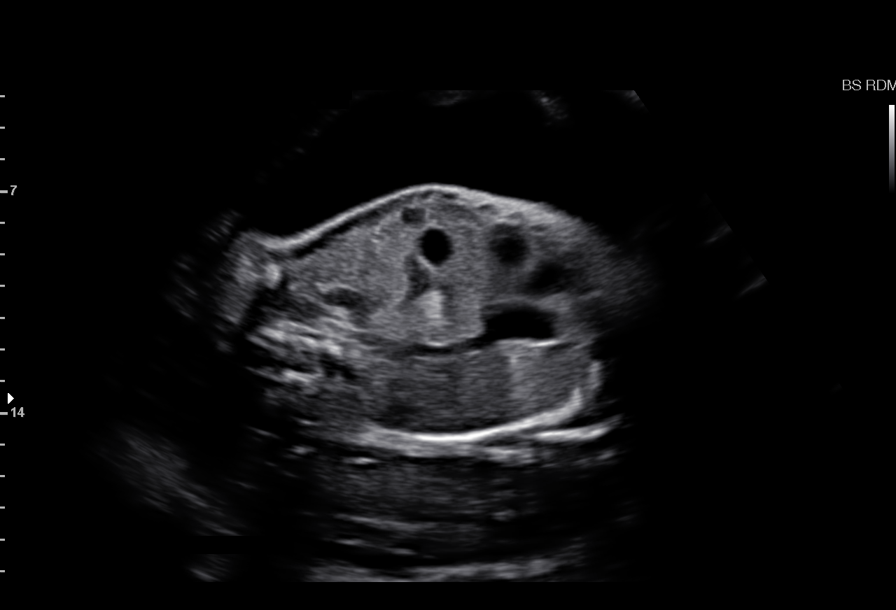
[im 15/18]
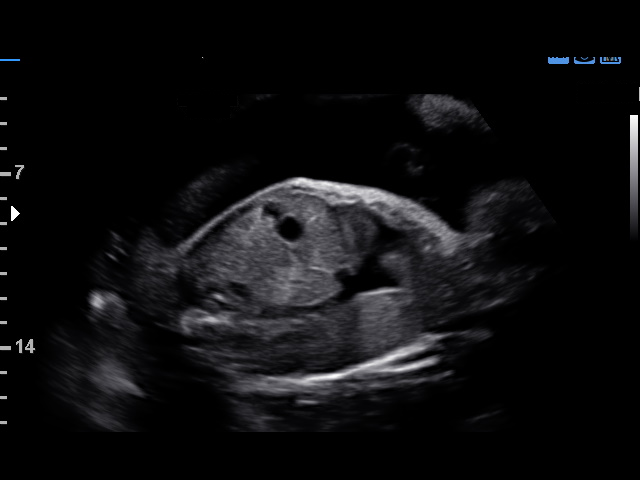
[im 16/18]
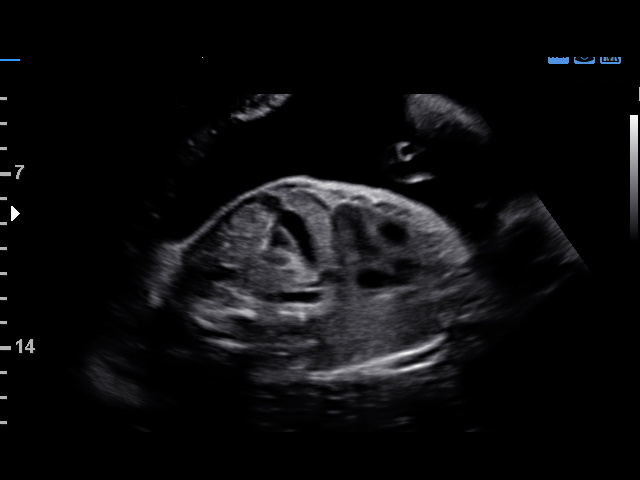
[im 18/18]
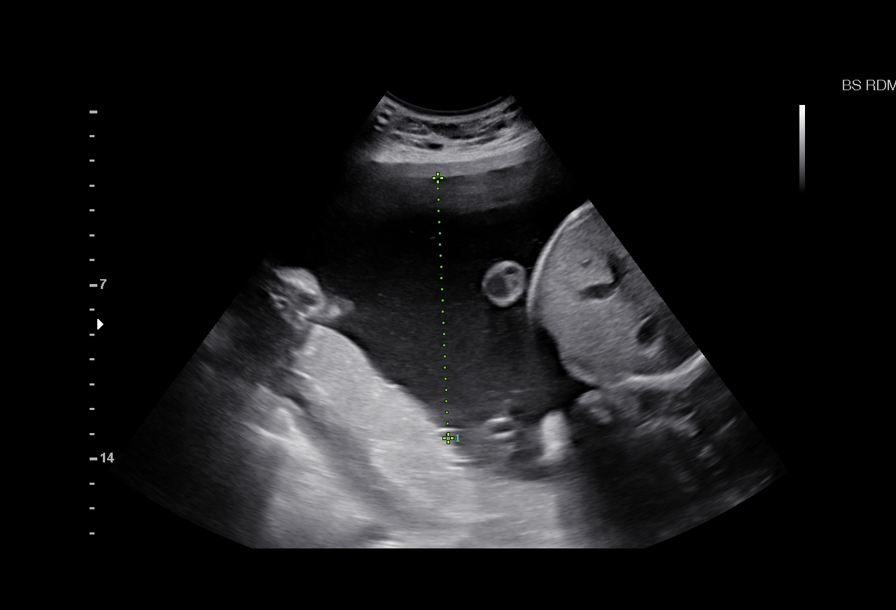

[12 of 18 positions shown; findings below may reference images not displayed]

OBSTETRICS REPORT
(Signed Final 09/30/2015 [DATE])

Name:       NDJANA KAPAWA                      Visit  09/30/2015 [DATE]
Date:

Service(s) Provided

Indications

Polyhydramnios, third trimester, antepartum
condition or complication, unspecified fetus
30 weeks gestation of pregnancy
Fetal Evaluation

Num Of             1
Fetuses:
Fetal Heart        159                          bpm
Rate:
Cardiac Activity:  Observed
Presentation:      Cephalic

Amniotic Fluid
AFI FV:      Polyhydramnios
AFI Sum:     22.34    cm      89  %Tile     Larg Pckt:    10.5   cm
RUQ:   7.97    cm    RLQ:   3.34    cm   LUQ:    4.76    cm   LLQ:    6.27   cm
Biophysical Evaluation

Amniotic F.V:   Polyhydramnios              F. Tone:        Observed
F. Movement:    Observed                    Score:          [DATE]
F. Breathing:   Observed
Gestational Age

LMP:           31w 4d        Date:  02/21/15                  EDD:   11/28/15
Best:          30w 2d    Det. By:   Early Ultrasound          EDD:   12/07/15
(04/24/15)
Impression

SIUP at 30+2 weeks
Mild polyhydramnios
BPP [DATE]

## 2016-10-04 ENCOUNTER — Emergency Department
Admission: EM | Admit: 2016-10-04 | Discharge: 2016-10-04 | Disposition: A | Discharge status: Home / Self Care | Payer: BLUE CROSS/BLUE SHIELD | Attending: Emergency Medicine | Admitting: Emergency Medicine

## 2016-10-04 ENCOUNTER — Encounter: Payer: Self-pay | Admitting: Emergency Medicine

## 2016-10-04 ENCOUNTER — Emergency Department: Payer: BLUE CROSS/BLUE SHIELD

## 2016-10-04 DIAGNOSIS — E876 Hypokalemia: Secondary | ICD-10-CM | POA: Diagnosis not present

## 2016-10-04 DIAGNOSIS — Z3A01 Less than 8 weeks gestation of pregnancy: Secondary | ICD-10-CM | POA: Insufficient documentation

## 2016-10-04 DIAGNOSIS — Z87891 Personal history of nicotine dependence: Secondary | ICD-10-CM | POA: Insufficient documentation

## 2016-10-04 DIAGNOSIS — O2391 Unspecified genitourinary tract infection in pregnancy, first trimester: Secondary | ICD-10-CM | POA: Diagnosis not present

## 2016-10-04 DIAGNOSIS — Z79899 Other long term (current) drug therapy: Secondary | ICD-10-CM | POA: Diagnosis not present

## 2016-10-04 DIAGNOSIS — O4690 Antepartum hemorrhage, unspecified, unspecified trimester: Secondary | ICD-10-CM

## 2016-10-04 DIAGNOSIS — Z791 Long term (current) use of non-steroidal anti-inflammatories (NSAID): Secondary | ICD-10-CM | POA: Insufficient documentation

## 2016-10-04 DIAGNOSIS — O209 Hemorrhage in early pregnancy, unspecified: Secondary | ICD-10-CM | POA: Diagnosis not present

## 2016-10-04 DIAGNOSIS — O99281 Endocrine, nutritional and metabolic diseases complicating pregnancy, first trimester: Secondary | ICD-10-CM | POA: Diagnosis not present

## 2016-10-04 DIAGNOSIS — R109 Unspecified abdominal pain: Secondary | ICD-10-CM

## 2016-10-04 DIAGNOSIS — R1032 Left lower quadrant pain: Secondary | ICD-10-CM

## 2016-10-04 DIAGNOSIS — N9489 Other specified conditions associated with female genital organs and menstrual cycle: Secondary | ICD-10-CM | POA: Insufficient documentation

## 2016-10-04 DIAGNOSIS — O9989 Other specified diseases and conditions complicating pregnancy, childbirth and the puerperium: Secondary | ICD-10-CM

## 2016-10-04 DIAGNOSIS — R8271 Bacteriuria: Secondary | ICD-10-CM

## 2016-10-04 DIAGNOSIS — O26891 Other specified pregnancy related conditions, first trimester: Secondary | ICD-10-CM

## 2016-10-04 LAB — CBC WITH DIFFERENTIAL/PLATELET
BASOS ABS: 0 10*3/uL (ref 0–0.1)
BASOS PCT: 1 %
EOS ABS: 0.2 10*3/uL (ref 0–0.7)
EOS PCT: 2 %
HCT: 41.1 % (ref 35.0–47.0)
Hemoglobin: 14.1 g/dL (ref 12.0–16.0)
Lymphocytes Relative: 32 %
Lymphs Abs: 2.7 10*3/uL (ref 1.0–3.6)
MCH: 32 pg (ref 26.0–34.0)
MCHC: 34.4 g/dL (ref 32.0–36.0)
MCV: 93 fL (ref 80.0–100.0)
Monocytes Absolute: 0.6 10*3/uL (ref 0.2–0.9)
Monocytes Relative: 8 %
Neutro Abs: 4.9 10*3/uL (ref 1.4–6.5)
Neutrophils Relative %: 57 %
PLATELETS: 323 10*3/uL (ref 150–440)
RBC: 4.42 MIL/uL (ref 3.80–5.20)
RDW: 14.1 % (ref 11.5–14.5)
WBC: 8.5 10*3/uL (ref 3.6–11.0)

## 2016-10-04 LAB — URINALYSIS COMPLETE WITH MICROSCOPIC (ARMC ONLY)
BILIRUBIN URINE: NEGATIVE
GLUCOSE, UA: NEGATIVE mg/dL
Nitrite: NEGATIVE
Protein, ur: 30 mg/dL — AB
Specific Gravity, Urine: 1.021 (ref 1.005–1.030)
pH: 6 (ref 5.0–8.0)

## 2016-10-04 LAB — HCG, QUANTITATIVE, PREGNANCY: HCG, BETA CHAIN, QUANT, S: 10249 m[IU]/mL — AB (ref <5)

## 2016-10-04 LAB — ABO/RH: ABO/RH(D): O POS

## 2016-10-04 LAB — BASIC METABOLIC PANEL
Anion gap: 9 (ref 5–15)
CO2: 23 mmol/L (ref 22–32)
Calcium: 8.8 mg/dL — ABNORMAL LOW (ref 8.9–10.3)
Chloride: 103 mmol/L (ref 101–111)
Creatinine, Ser: 0.54 mg/dL (ref 0.44–1.00)
Glucose, Bld: 80 mg/dL (ref 65–99)
POTASSIUM: 2.9 mmol/L — AB (ref 3.5–5.1)
SODIUM: 135 mmol/L (ref 135–145)

## 2016-10-04 LAB — POCT PREGNANCY, URINE: Preg Test, Ur: POSITIVE — AB

## 2016-10-04 MED ORDER — POTASSIUM CHLORIDE CRYS ER 20 MEQ PO TBCR: 20.0000 meq | EXTENDED_RELEASE_TABLET | Freq: Two times a day (BID) | ORAL | 1 refills | Status: AC

## 2016-10-04 MED ORDER — NITROFURANTOIN MONOHYD MACRO 100 MG PO CAPS: 100.0000 mg | ORAL_CAPSULE | Freq: Two times a day (BID) | ORAL | 0 refills | Status: AC

## 2016-10-04 MED ORDER — NITROFURANTOIN MONOHYD MACRO 100 MG PO CAPS
100.0000 mg | ORAL_CAPSULE | Freq: Once | ORAL | Status: AC
  Administered 2016-10-04: 100 mg via ORAL
  Filled 2016-10-04 (×2): qty 1

## 2016-10-04 MED ORDER — POTASSIUM CHLORIDE CRYS ER 20 MEQ PO TBCR
40.0000 meq | EXTENDED_RELEASE_TABLET | Freq: Once | ORAL | Status: AC
  Administered 2016-10-04: 40 meq via ORAL
  Filled 2016-10-04 (×2): qty 2

## 2016-10-04 NOTE — Discharge Instructions (Signed)
Take the antibiotics as prescribed for 5 days. Take one pill of potassium twice a day. Follow-up with OB/GYN in 1-2 weeks for repeat ultrasound to see if this is a viable pregnancy. Return to the emergency room if you have vaginal bleeding, worsening or new abdominal pain, or any other symptoms concerning to you.

## 2016-10-04 NOTE — ED Provider Notes (Signed)
[facility] Emergency Department Provider Note  ____________________________________________  Time seen: Approximately 6:21 PM  I have reviewed the triage vital signs and the nursing notes.   HISTORY  Chief Complaint Vaginal Bleeding and Abdominal Pain   HPI Isabel Weber is a 30 y.o. female G2P1 currently at 9 weeks based on LMP who presents for evaluation of vaginal bleeding and left lower quadrant abdominal pain. Patient reports 3 days of left lower quadrant abdominal pain, intermittent, dull, 8 out of 10. Today she woke up and started having vaginal bleeding. She reports that she has already changed 2 pads. She denies nausea, vomiting, dysuria, hematuria, diarrhea, constipation. Patient had no complications with her first pregnancy that ended in a full term C-section as patient did not go into labor. Patient has not established care for this pregnancy yet and has not had an ultrasound confirming the pregnancy.  Past Medical History:  Diagnosis Date  . Medical history non-contributory     Patient Active Problem List   Diagnosis Date Noted  . Status post cesarean section 12/17/2015  . Post term pregnancy 12/15/2015  . Obesity in pregnancy 12/13/2015  . Group B Streptococcus carrier, +RV culture, currently pregnant 11/11/2015  . 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 Supervision of normal pregnancy 05/04/2015    Past Surgical History:  Procedure Laterality Date  . CESAREAN SECTION N/A 12/15/2015   Procedure: CESAREAN SECTION;  Surgeon: Willodean Rosenthal, MD;  Location: [facility] OR[facility];  Service: Obstetrics;  Laterality: N/A;  . NO PAST SURGERIES      Prior to Admission medications   Medication Sig Start Date End Date Taking? Authorizing Provider  acetaminophen (TYLENOL) 500 MG tablet Take 1,000 mg by mouth every 6 (six) hours as needed for mild pain or moderate pain.    Historical Provider, MD  calcium carbonate  (TUMS - DOSED IN MG ELEMENTAL CALCIUM) 500 MG chewable tablet Chew 2 tablets by mouth 3 (three) times daily with meals as needed for indigestion or heartburn.    Historical Provider, MD  nitrofurantoin, macrocrystal-monohydrate, (MACROBID) 100 MG capsule Take 1 capsule (100 mg total) by mouth 2 (two) times daily. 10/04/16 10/09/16  Nita Sickle, MD  Norethindrone Acetate-Ethinyl Estradiol (JUNEL,LOESTRIN,MICROGESTIN) 1.5-30 MG-MCG tablet Take 1 tablet by mouth daily. 01/24/16   Willodean Rosenthal, MD  potassium chloride SA (K-DUR,KLOR-CON) 20 MEQ tablet Take 1 tablet (20 mEq total) by mouth 2 (two) times daily. 10/04/16   Nita Sickle, MD  Prenatal Vit-Fe Fumarate-FA (PRENATAL MULTIVITAMIN) TABS tablet Take 1 tablet by mouth daily at 12 noon.    Historical Provider, MD    Allergies Patient has no known allergies.  Family History  Problem Relation Age of Onset  . Hypertension Mother   . Diabetes Paternal Uncle     Social History Social History  Substance Use Topics  . Smoking status: Former Smoker    Packs/day: 0.50    Years: 5.00    Types: Cigarettes  . Smokeless tobacco: Never Used  . Alcohol use No    Review of Systems  Constitutional: Negative for fever. Eyes: Negative for visual changes. ENT: Negative for sore throat. Cardiovascular: Negative for chest pain. Respiratory: Negative for shortness of breath. Gastrointestinal: + LLQ abdominal pain. No vomiting or diarrhea. Genitourinary: Negative for dysuria. + vaginal bleeding Musculoskeletal: Negative for back pain. Skin: Negative for rash. Neurological: Negative for headaches, weakness or numbness.  ____________________________________________   PHYSICAL EXAM:  VITAL SIGNS: ED Triage Vitals  Enc Vitals Group     BP 10/04/16 1715 (!) 153/87     Pulse Rate 10/04/16 1715 80     Resp 10/04/16 1715 16     Temp 10/04/16 1715 98.6 F (37 C)     Temp Source 10/04/16 1715 Oral     SpO2 10/04/16 1715 98 %      Weight 10/04/16 1716 183 lb (83 kg)     Height 10/04/16 1716 5\' 2"  (1.575 m)     Head Circumference --      Peak Flow --      Pain Score 10/04/16 1716 10     Pain Loc --      Pain Edu? --      Excl. in GC? --     Constitutional: Alert and oriented. Well appearing and in no apparent distress. HEENT:      Head: Normocephalic and atraumatic.         Eyes: Conjunctivae are normal. Sclera is non-icteric. EOMI. PERRL      Mouth/Throat: Mucous membranes are moist.       Neck: Supple with no signs of meningismus. Cardiovascular: Regular rate and rhythm. No murmurs, gallops, or rubs. 2+ symmetrical distal pulses are present in all extremities. No JVD. Respiratory: Normal respiratory effort. Lungs are clear to auscultation bilaterally. No wheezes, crackles, or rhonchi.  Gastrointestinal: Soft, ttp over the LLQ, non distended with positive bowel sounds. No rebound or guarding. Genitourinary: No CVA tenderness. Pelvic exam: Normal external genitalia, no rashes or lesions. Normal cervical mucus. Small amount of blood in the vaginal vault. Os closed. No cervical motion tenderness.  No uterine or adnexal tenderness.   Musculoskeletal: Nontender with normal range of motion in all extremities. No edema, cyanosis, or erythema of extremities. Neurologic: Normal speech and language. Face is symmetric. Moving all extremities. No gross focal neurologic deficits are appreciated. Skin: Skin is warm, dry and intact. No rash noted. Psychiatric: Mood and affect are normal. Speech and behavior are normal.  ____________________________________________   LABS (all labs ordered are listed, but only abnormal results are displayed)  Labs Reviewed  BASIC METABOLIC PANEL - Abnormal; Notable for the following:       Result Value   Potassium 2.9 (*)    BUN <5 (*)    Calcium 8.8 (*)    All other components within normal limits  HCG, QUANTITATIVE, PREGNANCY - Abnormal; Notable for the following:    hCG, Beta Chain,  Quant, S 10,249 (*)    All other components within normal limits  URINALYSIS COMPLETEWITH MICROSCOPIC ([facility] ONLY) - Abnormal; Notable for the following:    Color, Urine YELLOW (*)    APPearance CLEAR (*)    Ketones, ur 1+ (*)    Hgb urine dipstick 3+ (*)    Protein, ur 30 (*)    Leukocytes, UA 2+ (*)    Bacteria, UA RARE (*)    Squamous Epithelial / LPF 0-5 (*)    All other components within normal limits  POCT PREGNANCY, URINE - Abnormal; Notable for the following:    Preg Test, Ur POSITIVE (*)    All other components within normal limits  URINE CULTURE  CBC WITH DIFFERENTIAL/PLATELET  ABO/RH   ____________________________________________  EKG  ED ECG REPORT I, Nita Sicklearolina Teonia Yager, the attending physician, personally viewed and interpreted this ECG.  Normal sinus rhythm, rate of 75, normal intervals, normal axis, no ST elevations or depressions. ____________________________________________  RADIOLOGY  TV US with doppler: 1. Intrauterine gestational sac estimated  at 5 weeks and 6 days gestation. Small yolk sac but no embryonic pole. Probable early intrauterine gestational sac, but no fetal pole, or cardiac activity yet visualized. Recommend follow-up quantitative B-HCG levels and follow-up US in 14 days to assess viability. This recommendation follows SRU consensus guidelines: Diagnostic Criteria for Nonviable Pregnancy Early in the First Trimester. Malva Limes Engl J Med 2013; 696:2952-84; 369:1443-51. 2. Normal ovaries. 3. Small amount of free pelvic fluid. ____________________________________________   PROCEDURES  Procedure(s) performed: None Procedures Critical Care performed:  None ____________________________________________   INITIAL IMPRESSION / ASSESSMENT AND PLAN / ED COURSE  30 y.o. female G2P1 currently at 9 weeks based on LMP who presents for evaluation of vaginal bleeding and left lower quadrant abdominal pain. Patient has not undergone ultrasound for this pregnancy. She  is tender to palpation on the left lower quadrant with no rebound or guarding. We'll get a transvaginal ultrasound with Dopplers to rule out ectopic versus torsion versus miscarriage. Will get labs including ABO, urinalysis.   Clinical Course as of Oct 04 2046  Thu Oct 04, 2016  2042 Ultrasound showing Intrauterine gestational sac estimated at 5 weeks and 6 days gestation. Small yolk sac but no embryonic pole.  Patient was told to follow up with OB/GYN in 1-2 weeks for repeat beta Quant and ultrasound to determine if this is a viable pregnancy. Ovaries looked normal and ultrasound. Patient was found to have bacteriuria was started on Macrobid. Patient was found to be hypokalemic with a K of 2.9. She reports that she hasn't really had much of an appetite during this pregnancy and is not eating as much. She was given by mouth K supplementation and will be discharged home on by mouth K  [CV]    Clinical Course User Index [CV] Nita Sicklearolina Venola Castello, MD    Pertinent labs & imaging results that were available during my care of the patient were reviewed by me and considered in my medical decision making (see chart for details).    ____________________________________________   FINAL CLINICAL IMPRESSION(S) / ED DIAGNOSES  Final diagnoses:  LLQ abdominal pain  Abdominal pain during pregnancy in first trimester  Bacteriuria during pregnancy  Hypokalemia  Vaginal bleeding in pregnancy      NEW MEDICATIONS STARTED DURING THIS VISIT:  New Prescriptions   NITROFURANTOIN, MACROCRYSTAL-MONOHYDRATE, (MACROBID) 100 MG CAPSULE    Take 1 capsule (100 mg total) by mouth 2 (two) times daily.   POTASSIUM CHLORIDE SA (K-DUR,KLOR-CON) 20 MEQ TABLET    Take 1 tablet (20 mEq total) by mouth 2 (two) times daily.     Note:  This document was prepared using Dragon voice recognition software and may include unintentional dictation errors.    Nita Sicklearolina Moustafa Mossa, MD 10/04/16 [phone]

## 2016-10-04 NOTE — ED Triage Notes (Signed)
Pt to ED from home c/o left side abd pain x3 days and vaginal bleeding today.  Pt reports [redacted] weeks pregnant, second pregnancy.  Denies n/v, states diarrhea.

## 2016-10-05 ENCOUNTER — Emergency Department
Admission: EM | Admit: 2016-10-05 | Discharge: 2016-10-05 | Disposition: A | Discharge status: Home / Self Care | Payer: BLUE CROSS/BLUE SHIELD | Attending: Emergency Medicine | Admitting: Emergency Medicine

## 2016-10-05 ENCOUNTER — Encounter: Payer: Self-pay | Admitting: Emergency Medicine

## 2016-10-05 DIAGNOSIS — O200 Threatened abortion: Secondary | ICD-10-CM | POA: Insufficient documentation

## 2016-10-05 DIAGNOSIS — Z87891 Personal history of nicotine dependence: Secondary | ICD-10-CM | POA: Diagnosis not present

## 2016-10-05 DIAGNOSIS — Z79899 Other long term (current) drug therapy: Secondary | ICD-10-CM | POA: Diagnosis not present

## 2016-10-05 DIAGNOSIS — Z3A01 Less than 8 weeks gestation of pregnancy: Secondary | ICD-10-CM | POA: Insufficient documentation

## 2016-10-05 DIAGNOSIS — O4690 Antepartum hemorrhage, unspecified, unspecified trimester: Secondary | ICD-10-CM

## 2016-10-05 DIAGNOSIS — O209 Hemorrhage in early pregnancy, unspecified: Secondary | ICD-10-CM | POA: Diagnosis present

## 2016-10-05 LAB — HCG, QUANTITATIVE, PREGNANCY: hCG, Beta Chain, Quant, S: 9949 m[IU]/mL — ABNORMAL HIGH (ref <5)

## 2016-10-05 LAB — CBC
HEMATOCRIT: 39.4 % (ref 35.0–47.0)
Hemoglobin: 13.5 g/dL (ref 12.0–16.0)
MCH: 31.7 pg (ref 26.0–34.0)
MCHC: 34.3 g/dL (ref 32.0–36.0)
MCV: 92.3 fL (ref 80.0–100.0)
Platelets: 340 10*3/uL (ref 150–440)
RBC: 4.27 MIL/uL (ref 3.80–5.20)
RDW: 13.7 % (ref 11.5–14.5)
WBC: 8.3 10*3/uL (ref 3.6–11.0)

## 2016-10-05 MED ORDER — KETOROLAC TROMETHAMINE 60 MG/2ML IM SOLN
60.0000 mg | Freq: Once | INTRAMUSCULAR | Status: AC
  Administered 2016-10-05: 60 mg via INTRAMUSCULAR
  Filled 2016-10-05: qty 2

## 2016-10-05 NOTE — ED Notes (Signed)
Pt. Going home with family.  Pt. Given 2 day work excuse.

## 2016-10-05 NOTE — ED Provider Notes (Signed)
[facility] Emergency Department Provider Note  ____________________________________________  Time seen: Approximately 7:04 PM  I have reviewed the triage vital signs and the nursing notes.   HISTORY  Chief Complaint Vaginal Bleeding   HPI Isabel Weber is a 30 y.o. female G2P1 at [redacted] weeks GA who presents for evaluation of continuing vaginal bleeding. Patient seen yesterday here with US showing early IUP vs nonviable pregnancy and was told to f/u with obgyn in 1 week for repeat imaging and hCG. Patient reports that she has been bleeding a lot and passing large blood clots since earlier today. Also complaining of moderate suprapubic cramping and vaginal pressure since earlier today, intermittent, non-radiating. Patient denies syncope, dizziness, chest pain, shortness of breath.  Past Medical History:  Diagnosis Date  . Medical history non-contributory     Patient Active Problem List   Diagnosis Date Noted  . Status post cesarean section 12/17/2015  . Post term pregnancy 12/15/2015  . Obesity in pregnancy 12/13/2015  . Group B Streptococcus carrier, +RV culture, currently pregnant 11/11/2015  . 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 Supervision of normal pregnancy 05/04/2015    Past Surgical History:  Procedure Laterality Date  . CESAREAN SECTION N/A 12/15/2015   Procedure: CESAREAN SECTION;  Surgeon: Willodean Rosenthal, MD;  Location: [facility]S;  Service: Obstetrics;  Laterality: N/A;  . NO PAST SURGERIES      Prior to Admission medications   Medication Sig Start Date End Date Taking? Authorizing Provider  acetaminophen (TYLENOL) 500 MG tablet Take 1,000 mg by mouth every 6 (six) hours as needed for mild pain or moderate pain.    Historical Provider, MD  calcium carbonate (TUMS - DOSED IN MG ELEMENTAL CALCIUM) 500 MG chewable tablet Chew 2 tablets by mouth 3 (three) times daily with meals as needed for indigestion  or heartburn.    Historical Provider, MD  nitrofurantoin, macrocrystal-monohydrate, (MACROBID) 100 MG capsule Take 1 capsule (100 mg total) by mouth 2 (two) times daily. 10/04/16 10/09/16  Nita Sickle, MD  Norethindrone Acetate-Ethinyl Estradiol (JUNEL,LOESTRIN,MICROGESTIN) 1.5-30 MG-MCG tablet Take 1 tablet by mouth daily. 01/24/16   Willodean Rosenthal, MD  potassium chloride SA (K-DUR,KLOR-CON) 20 MEQ tablet Take 1 tablet (20 mEq total) by mouth 2 (two) times daily. 10/04/16   Nita Sickle, MD  Prenatal Vit-Fe Fumarate-FA (PRENATAL MULTIVITAMIN) TABS tablet Take 1 tablet by mouth daily at 12 noon.    Historical Provider, MD    Allergies Patient has no known allergies.  Family History  Problem Relation Age of Onset  . Hypertension Mother   . Diabetes Paternal Uncle     Social History Social History  Substance Use Topics  . Smoking status: Former Smoker    Packs/day: 0.50    Years: 5.00    Types: Cigarettes  . Smokeless tobacco: Never Used  . Alcohol use No    Review of Systems  Constitutional: Negative for fever. Eyes: Negative for visual changes. ENT: Negative for sore throat. Neck: No neck pain  Cardiovascular: Negative for chest pain. Respiratory: Negative for shortness of breath. Gastrointestinal: + suprapubic cramping. No vomiting or diarrhea. Genitourinary: Negative for dysuria. + vaginal bleeding Musculoskeletal: Negative for back pain. Skin: Negative for rash. Neurological: Negative for headaches, weakness or numbness. Psych: No SI or HI  ____________________________________________   PHYSICAL EXAM:  VITAL SIGNS: ED Triage Vitals  Enc Vitals Group     BP 10/05/16 1848 (!) 143/80     Pulse  Rate 10/05/16 1848 (!) 56     Resp 10/05/16 1848 18     Temp 10/05/16 1848 98.4 F (36.9 C)     Temp Source 10/05/16 1848 Oral     SpO2 10/05/16 1848 96 %     Weight 10/05/16 1849 185 lb (83.9 kg)     Height 10/05/16 1849 5\' 2"  (1.575 m)     Head  Circumference --      Peak Flow --      Pain Score 10/05/16 1849 10     Pain Loc --      Pain Edu? --      Excl. in GC? --     Constitutional: Alert and oriented. Well appearing and in no apparent distress. HEENT:      Head: Normocephalic and atraumatic.         Eyes: Conjunctivae are normal. Sclera is non-icteric. EOMI. PERRL      Mouth/Throat: Mucous membranes are moist.       Neck: Supple with no signs of meningismus. Cardiovascular: Regular rate and rhythm. No murmurs, gallops, or rubs. 2+ symmetrical distal pulses are present in all extremities. No JVD. Respiratory: Normal respiratory effort. Lungs are clear to auscultation bilaterally. No wheezes, crackles, or rhonchi.  Gastrointestinal: Soft, non tender, and non distended with positive bowel sounds. No rebound or guarding. Pelvic exam: Normal external genitalia, no rashes or lesions. Normal cervical mucus, blood in vaginal vault. Os closed. No cervical motion tenderness.  No uterine or adnexal tenderness.   Musculoskeletal: Nontender with normal range of motion in all extremities. No edema, cyanosis, or erythema of extremities. Neurologic: Normal speech and language. Face is symmetric. Moving all extremities. No gross focal neurologic deficits are appreciated. Skin: Skin is warm, dry and intact. No rash noted. Psychiatric: Mood and affect are normal. Speech and behavior are normal.  ____________________________________________   LABS (all labs ordered are listed, but only abnormal results are displayed)  Labs Reviewed  HCG, QUANTITATIVE, PREGNANCY - Abnormal; Notable for the following:       Result Value   hCG, Beta Chain, Quant, S 9,949 (*)    All other components within normal limits  CBC   ____________________________________________  EKG  none ____________________________________________  RADIOLOGY  none  ____________________________________________   PROCEDURES  Procedure(s) performed:  None Procedures Critical Care performed:  None ____________________________________________   INITIAL IMPRESSION / ASSESSMENT AND PLAN / ED COURSE  30 y.o. female G2P1 at [redacted] weeks GA seen yesterday with US showing early IUP vs nonviable pregnancy who presents for evaluation of worsening vaginal bleeding and cramping since this am. Blood type O pos yesterday. Will trend hCG, CBC, and perform pelvic exam to evaluate for gestational sac in the os which could be evacuated. Will give IM toradol for pain.  Clinical Course    hgb stable. Hcg trending down consistent with miscarriage. Pelvic exam showing close os with blood in vaginal vault. Patient stable. Patient was discharged to f/u with obgyn in 1 week for repeat US and hcg.  Pertinent labs & imaging results that were available during my care of the patient were reviewed by me and considered in my medical decision making (see chart for details).    ____________________________________________   FINAL CLINICAL IMPRESSION(S) / ED DIAGNOSES  Final diagnoses:  Vaginal bleeding in pregnancy  Threatened miscarriage      NEW MEDICATIONS STARTED DURING THIS VISIT:  Discharge Medication List as of 10/05/2016  8:08 PM       Note:  This document was prepared using Dragon voice recognition software and may include unintentional dictation errors.    Nita Sicklearolina Travarius Lange, MD 10/09/16 1459

## 2016-10-05 NOTE — ED Triage Notes (Signed)
Pt reports vaginal bleeding that began approximately one hour ago. Pt states she is pregnant. Pt reports seen last night here for slight vaginal bleeding. and told her gestational age was 5 weeks 6 days. Pt states ultrasound last night showed no fetal distress. Pt reports passing clots and lower abdominal cramping.

## 2016-10-05 NOTE — Discharge Instructions (Signed)
Take ibuprofen for the cramping. Expect a longer and more severe menstrual period. Follow up with [facility] in 1 week for repeat hCG levels and ultrasound.

## 2016-10-06 LAB — URINE CULTURE

## 2017-05-23 IMAGING — US US OB TRANSVAGINAL
1 series · 14 of 28 positions shown · non-contrast
Comparison: Pelvic ultrasound 03/28/2015

CLINICAL DATA: Vaginal bleeding.  First-trimester pregnancy.

EXAM:
OBSTETRIC <14 WK US AND TRANSVAGINAL OB US
TECHNIQUE: Both transabdominal and transvaginal ultrasound examinations were
performed for complete evaluation of the gestation as well as the
maternal uterus, adnexal regions, and pelvic cul-de-sac.
Transvaginal technique was performed to assess early pregnancy.

[Series 1: us ob transvaginal · 0.24mm/px · 14 of 101 slices shown]
[im 4/101]
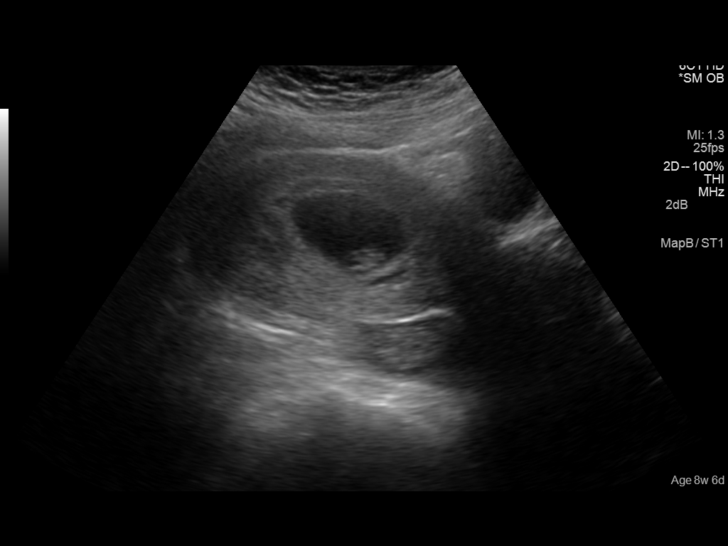
[im 12/101]
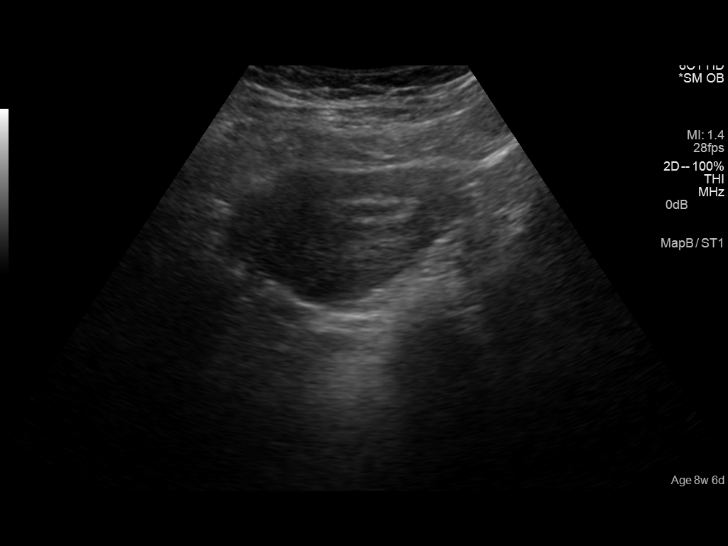
[im 19/101]
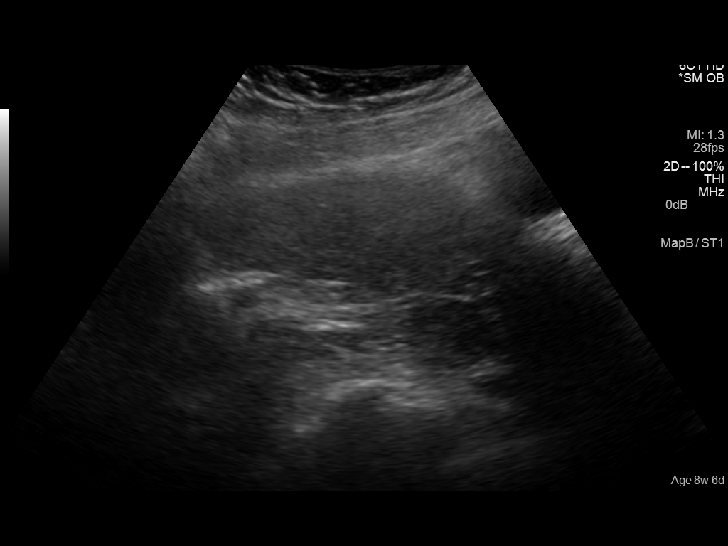
[im 26/101]
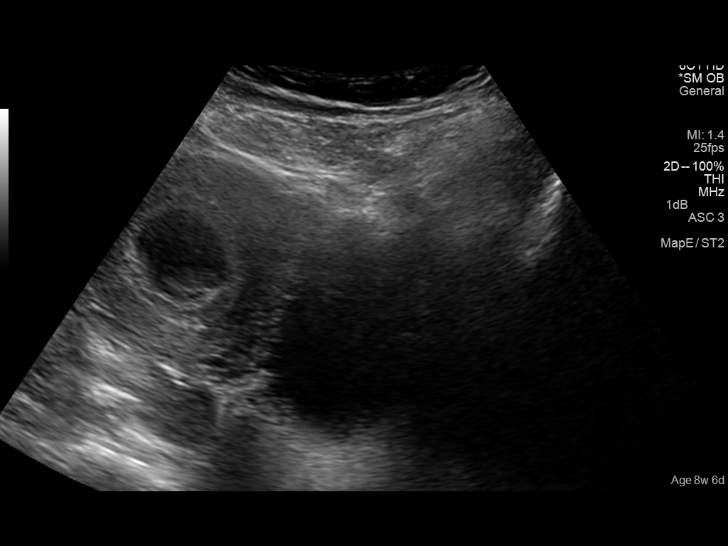
[im 34/101]
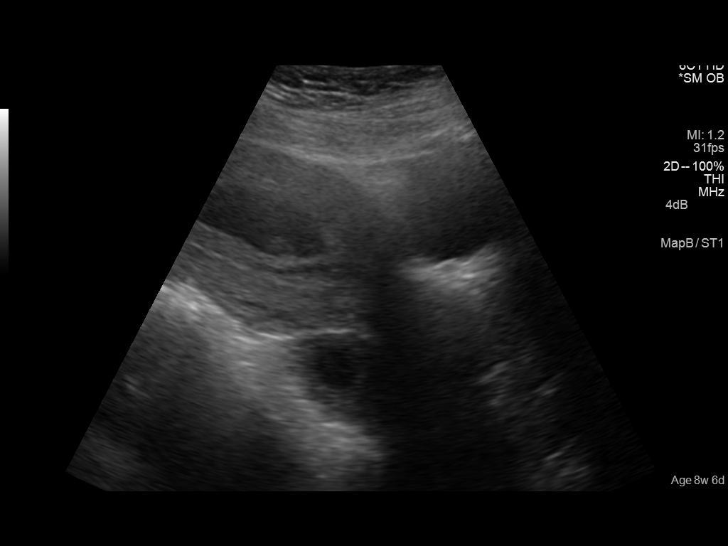
[im 41/101]
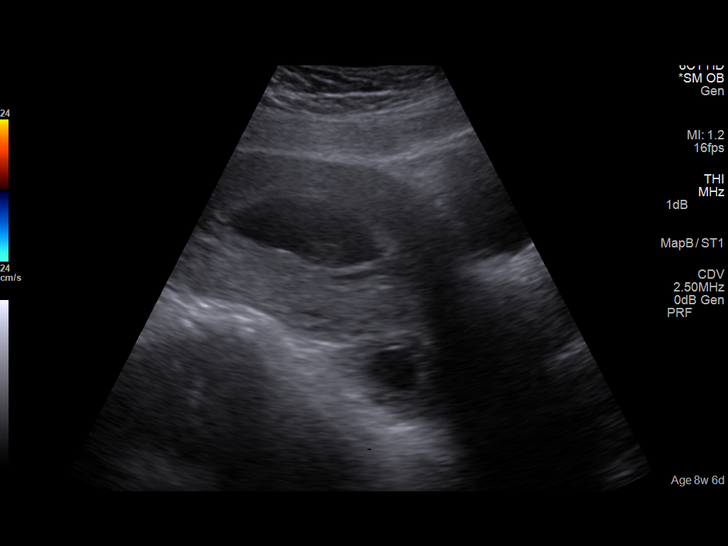
[im 49/101]
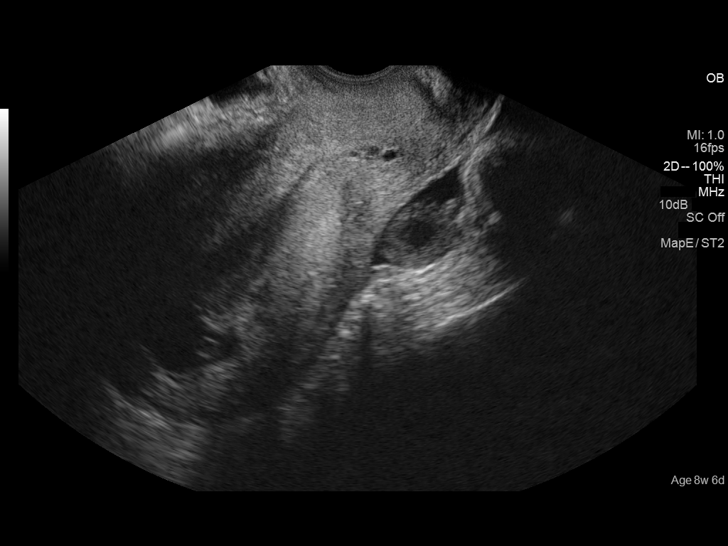
[im 56/101]
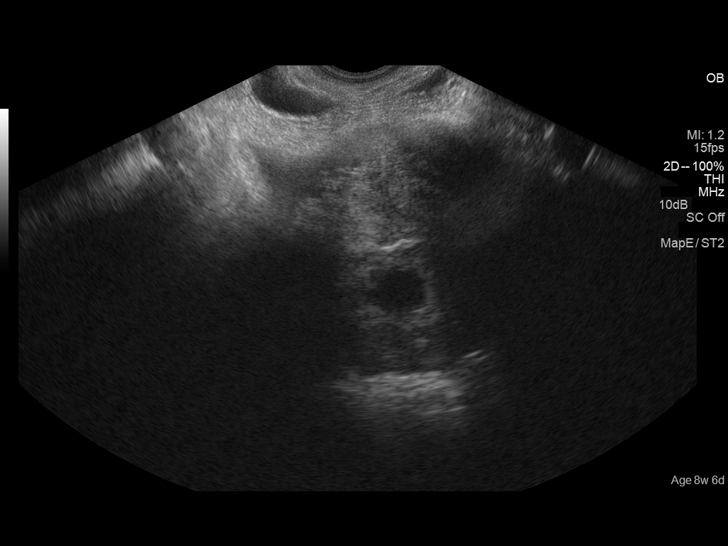
[im 63/101]
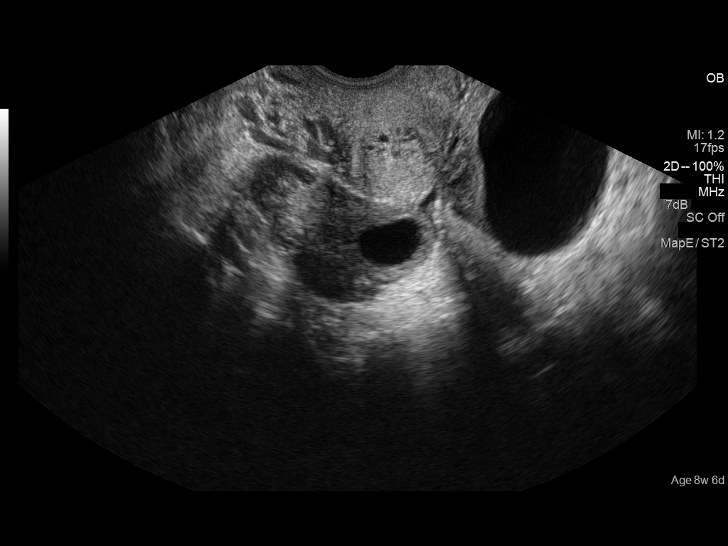
[im 71/101]
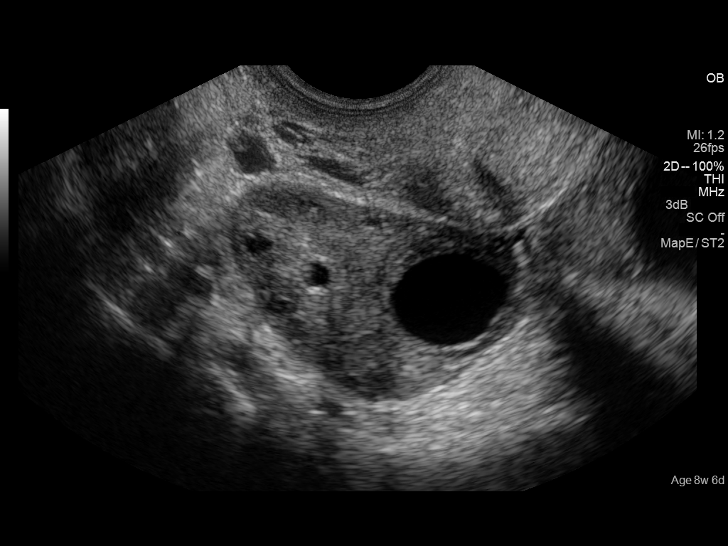
[im 78/101]
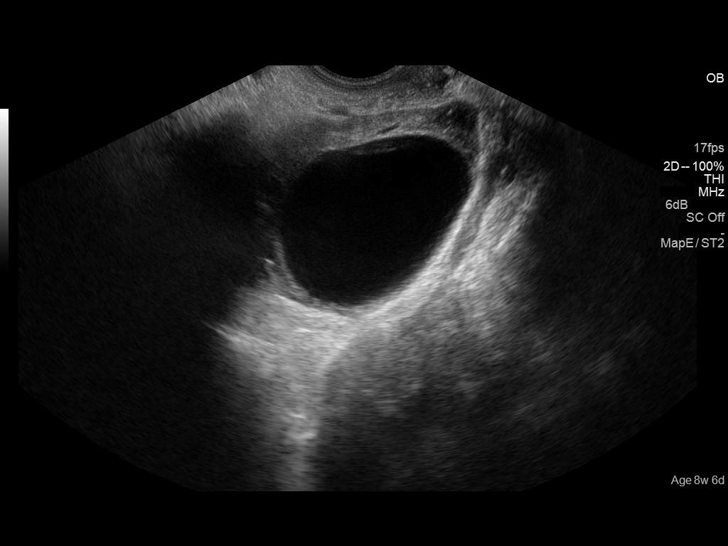
[im 86/101]
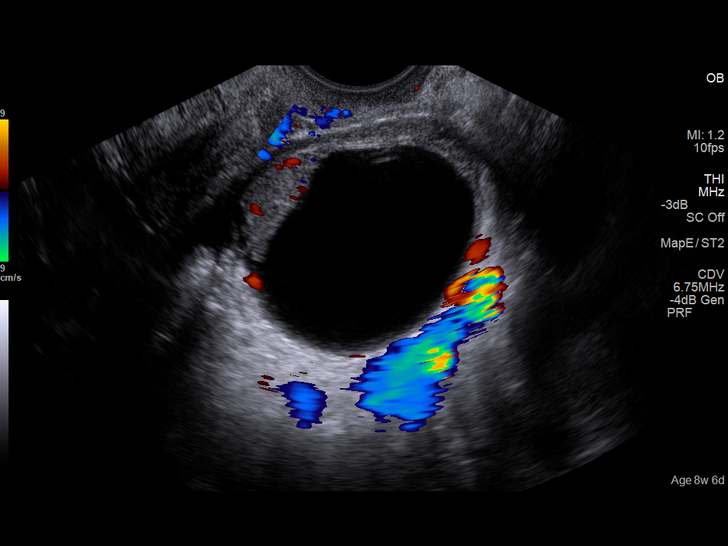
[im 93/101]
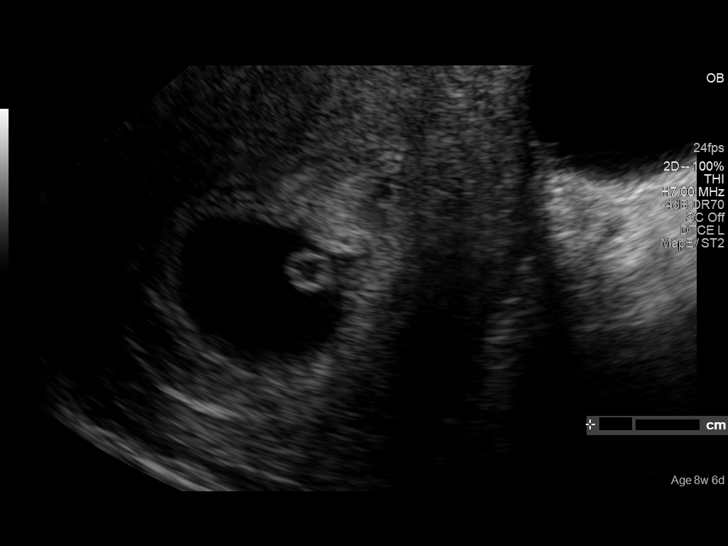
[im 101/101]
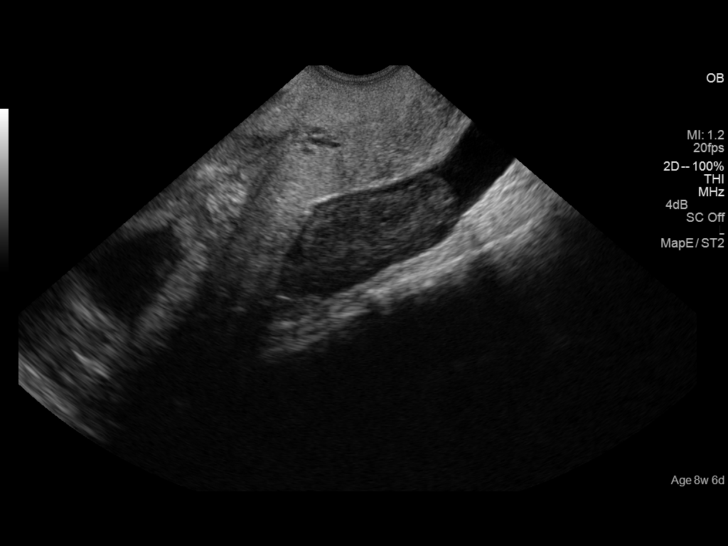

[14 of 28 positions shown; findings below may reference images not displayed]

FINDINGS: Intrauterine gestational sac: Visualized/normal in shape.

Yolk sac:  Present

Embryo:  Present

Cardiac Activity: Present

Heart Rate: 155  bpm

MSD:   mm    w     d

CRL:  13.2  Mm   7 w   4 d                  US EDC: 12/07/2015

Maternal uterus/adnexae: Bilateral adnexal cysts are noted. The
right-sided cystic lesion measures 4.0 x 5.1 x 4.3 cm. The
left-sided lesion is 2.1 x 2.7 x 2.1 cm. These both appear to be
within the ovaries. The right-sided lesion likely represents a
corpus luteal cyst. A small amount of free fluid is present.
IMPRESSION: 1. Single intrauterine pregnancy with an estimated gestational age
of 7 weeks and 4 days.
2. Bilateral ovarian cysts appear benign.

## 2021-02-13 ENCOUNTER — Encounter: Payer: Self-pay | Admitting: Emergency Medicine

## 2021-02-13 ENCOUNTER — Other Ambulatory Visit: Payer: Self-pay

## 2021-02-13 ENCOUNTER — Ambulatory Visit (INDEPENDENT_AMBULATORY_CARE_PROVIDER_SITE_OTHER): Payer: 59

## 2021-02-13 ENCOUNTER — Ambulatory Visit
Admission: EM | Admit: 2021-02-13 | Discharge: 2021-02-13 | Disposition: A | Discharge status: Home / Self Care | Payer: 59 | Attending: Physician Assistant | Admitting: Physician Assistant

## 2021-02-13 DIAGNOSIS — R0602 Shortness of breath: Secondary | ICD-10-CM | POA: Diagnosis not present

## 2021-02-13 DIAGNOSIS — J209 Acute bronchitis, unspecified: Secondary | ICD-10-CM | POA: Diagnosis not present

## 2021-02-13 DIAGNOSIS — F17210 Nicotine dependence, cigarettes, uncomplicated: Secondary | ICD-10-CM | POA: Diagnosis not present

## 2021-02-13 DIAGNOSIS — R051 Acute cough: Secondary | ICD-10-CM | POA: Diagnosis not present

## 2021-02-13 DIAGNOSIS — Z6831 Body mass index (BMI) 31.0-31.9, adult: Secondary | ICD-10-CM | POA: Insufficient documentation

## 2021-02-13 DIAGNOSIS — R630 Anorexia: Secondary | ICD-10-CM | POA: Diagnosis not present

## 2021-02-13 DIAGNOSIS — R059 Cough, unspecified: Secondary | ICD-10-CM

## 2021-02-13 DIAGNOSIS — Z20822 Contact with and (suspected) exposure to covid-19: Secondary | ICD-10-CM | POA: Diagnosis not present

## 2021-02-13 DIAGNOSIS — R509 Fever, unspecified: Secondary | ICD-10-CM | POA: Diagnosis not present

## 2021-02-13 DIAGNOSIS — R112 Nausea with vomiting, unspecified: Secondary | ICD-10-CM

## 2021-02-13 DIAGNOSIS — E876 Hypokalemia: Secondary | ICD-10-CM

## 2021-02-13 LAB — CBC WITH DIFFERENTIAL/PLATELET
Abs Immature Granulocytes: 0.06 10*3/uL (ref 0.00–0.07)
Basophils Absolute: 0 10*3/uL (ref 0.0–0.1)
Basophils Relative: 0 %
Eosinophils Absolute: 0 10*3/uL (ref 0.0–0.5)
Eosinophils Relative: 0 %
HCT: 42.1 % (ref 36.0–46.0)
Hemoglobin: 14.9 g/dL (ref 12.0–15.0)
Immature Granulocytes: 1 %
Lymphocytes Relative: 18 %
Lymphs Abs: 2.2 10*3/uL (ref 0.7–4.0)
MCH: 30.8 pg (ref 26.0–34.0)
MCHC: 35.4 g/dL (ref 30.0–36.0)
MCV: 87 fL (ref 80.0–100.0)
Monocytes Absolute: 0.9 10*3/uL (ref 0.1–1.0)
Monocytes Relative: 8 %
Neutro Abs: 8.7 10*3/uL — ABNORMAL HIGH (ref 1.7–7.7)
Neutrophils Relative %: 73 %
Platelets: 319 10*3/uL (ref 150–400)
RBC: 4.84 MIL/uL (ref 3.87–5.11)
RDW: 13.2 % (ref 11.5–15.5)
WBC: 11.9 10*3/uL — ABNORMAL HIGH (ref 4.0–10.5)
nRBC: 0 % (ref 0.0–0.2)

## 2021-02-13 LAB — URINALYSIS, COMPLETE (UACMP) WITH MICROSCOPIC
Glucose, UA: NEGATIVE mg/dL
Ketones, ur: 40 mg/dL — AB
Leukocytes,Ua: NEGATIVE
Nitrite: NEGATIVE
Protein, ur: 100 mg/dL — AB
Specific Gravity, Urine: 1.02 (ref 1.005–1.030)
WBC, UA: NONE SEEN WBC/hpf (ref 0–5)
pH: 6 (ref 5.0–8.0)

## 2021-02-13 LAB — COMPREHENSIVE METABOLIC PANEL
ALT: 50 U/L — ABNORMAL HIGH (ref 0–44)
AST: 60 U/L — ABNORMAL HIGH (ref 15–41)
Albumin: 4 g/dL (ref 3.5–5.0)
Alkaline Phosphatase: 87 U/L (ref 38–126)
Anion gap: 12 (ref 5–15)
BUN: 18 mg/dL (ref 6–20)
CO2: 21 mmol/L — ABNORMAL LOW (ref 22–32)
Calcium: 9.1 mg/dL (ref 8.9–10.3)
Chloride: 98 mmol/L (ref 98–111)
Creatinine, Ser: 0.8 mg/dL (ref 0.44–1.00)
GFR, Estimated: >60 mL/min (ref >60)
Glucose, Bld: 102 mg/dL — ABNORMAL HIGH (ref 70–99)
Potassium: 2.9 mmol/L — ABNORMAL LOW (ref 3.5–5.1)
Sodium: 131 mmol/L — ABNORMAL LOW (ref 135–145)
Total Bilirubin: 0.8 mg/dL (ref 0.3–1.2)
Total Protein: 8.4 g/dL — ABNORMAL HIGH (ref 6.5–8.1)

## 2021-02-13 LAB — RESP PANEL BY RT-PCR (FLU A&B, COVID) ARPGX2
Influenza A by PCR: NEGATIVE
Influenza B by PCR: NEGATIVE
SARS Coronavirus 2 by RT PCR: NEGATIVE

## 2021-02-13 MED ORDER — ONDANSETRON 4 MG PO TBDP: 4.0000 mg | ORAL_TABLET | Freq: Three times a day (TID) | ORAL | 0 refills | Status: AC | PRN

## 2021-02-13 MED ORDER — AZITHROMYCIN 250 MG PO TABS: 250.0000 mg | ORAL_TABLET | Freq: Every day | ORAL | 0 refills | Status: AC

## 2021-02-13 MED ORDER — ALBUTEROL SULFATE HFA 108 (90 BASE) MCG/ACT IN AERS: 1.0000 | INHALATION_SPRAY | Freq: Four times a day (QID) | RESPIRATORY_TRACT | 0 refills | Status: AC | PRN

## 2021-02-13 MED ORDER — PSEUDOEPH-BROMPHEN-DM 30-2-10 MG/5ML PO SYRP: 10.0000 mL | ORAL_SOLUTION | Freq: Four times a day (QID) | ORAL | 0 refills | Status: AC | PRN

## 2021-02-13 NOTE — ED Triage Notes (Addendum)
Patient in today c/o cough, sob and no appetite x 1 week. Patient states emesis has resolved and fever (102) yesterday. Patient states she can't "think straight" and is having body aches. Patient has taken OTC Mucinex DM with some relief and Naproxen. Patient has had the covid vaccines, no booster. Patient took an at home covid test on 02/08/21 and it was negative.

## 2021-02-13 NOTE — ED Provider Notes (Signed)
[facility]    CSN: [id number] Arrival date & time: 02/13/21  0841      History   Chief Complaint Chief Complaint  Patient presents with  . Cough  . Shortness of Breath  . Anorexia    HPI Isabel Weber is a 35 y.o. female presenting for 1 week history of fever, fatigue, body aches, cough, congestion, shortness of breath and chest discomfort, loss of appetite and nausea.  Patient says that she has not been able to eat any solid food in a week because she does not have an appetite.  She was previously vomiting but that has resolved over the past couple of days.  She says that she has been trying to increase her fluid intake and drink protein shakes.  She is also been taking Mucinex DM and naproxen which she says temporarily improves her symptoms.  She states that she cannot really think straight and feels very fatigued.  Patient did take a Covid test 5 days ago which was negative.  Denies any sick contacts and no known exposure to influenza or COVID-19.  She has been vaccinated for COVID-19 but not boosted.  No significant past medical history of cardiopulmonary disease.  No other complaints or concerns.  HPI  Past Medical History:  Diagnosis Date  . Medical history non-contributory     Patient Active Problem List   Diagnosis Date Noted  . Status post cesarean section 12/17/2015  . Post term pregnancy 12/15/2015  . Obesity in pregnancy 12/13/2015  . Group B Streptococcus carrier, +RV culture, currently pregnant 11/11/2015  . 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 Supervision of normal pregnancy 05/04/2015    Past Surgical History:  Procedure Laterality Date  . CESAREAN SECTION N/A 12/15/2015   Procedure: CESAREAN SECTION;  Surgeon: Willodean Rosenthal, MD;  Location: [facility];  Service: Obstetrics;  Laterality: N/A;    OB History    Gravida  3   Para  1   Term  1   Preterm      AB  1   Living  1     SAB  1   IAB       Ectopic      Multiple  0   Live Births  1            Home Medications    Prior to Admission medications   Medication Sig Start Date End Date Taking? Authorizing Provider  albuterol (VENTOLIN HFA) 108 (90 Base) MCG/ACT inhaler Inhale 1-2 puffs into the lungs every 6 (six) hours as needed for wheezing or shortness of breath. 02/13/21 03/15/21 Yes Eusebio Friendly B, PA-C  azithromycin (ZITHROMAX) 250 MG tablet Take 1 tablet (250 mg total) by mouth daily. Take first 2 tablets together, then 1 every day until finished. 02/13/21  Yes Eusebio Friendly B, PA-C  brompheniramine-pseudoephedrine-DM 30-2-10 MG/5ML syrup Take 10 mLs by mouth 4 (four) times daily as needed for up to 7 days. 02/13/21 02/20/21 Yes Eusebio Friendly B, PA-C  ondansetron (ZOFRAN ODT) 4 MG disintegrating tablet Take 1 tablet (4 mg total) by mouth every 8 (eight) hours as needed for up to 5 days for nausea or vomiting. 02/13/21 02/18/21 Yes Shirlee Latch, PA-C  acetaminophen (TYLENOL) 500 MG tablet Take 1,000 mg by mouth every 6 (six) hours as needed for mild pain or moderate pain.    [provider]  calcium carbonate (TUMS - DOSED IN MG ELEMENTAL CALCIUM) 500 MG  chewable tablet Chew 2 tablets by mouth 3 (three) times daily with meals as needed for indigestion or heartburn.    [provider]  Norethindrone Acetate-Ethinyl Estradiol (JUNEL,LOESTRIN,MICROGESTIN) 1.5-30 MG-MCG tablet Take 1 tablet by mouth daily. 01/24/16   Willodean Rosenthal, MD  potassium chloride SA (K-DUR,KLOR-CON) 20 MEQ tablet Take 1 tablet (20 mEq total) by mouth 2 (two) times daily. 10/04/16   Nita Sickle, MD  Prenatal Vit-Fe Fumarate-FA (PRENATAL MULTIVITAMIN) TABS tablet Take 1 tablet by mouth daily at 12 noon.    [provider]    Family History Family History  Problem Relation Age of Onset  . Hypertension Mother   . Healthy Father   . Diabetes Paternal Uncle     Social History Social History   Tobacco Use  .  Smoking status: Current Every Day Smoker    Packs/day: 0.50    Years: 5.00    Pack years: 2.50    Types: Cigarettes  . Smokeless tobacco: Never Used  Vaping Use  . Vaping Use: Never used  Substance Use Topics  . Alcohol use: Yes    Comment: social  . Drug use: No     Allergies   Patient has no known allergies.   Review of Systems Review of Systems  Constitutional: Positive for appetite change, fatigue and fever. Negative for chills and diaphoresis.  HENT: Positive for congestion. Negative for ear pain, rhinorrhea, sinus pressure, sinus pain and sore throat.   Respiratory: Positive for cough and shortness of breath.   Cardiovascular: Positive for chest pain.  Gastrointestinal: Positive for abdominal pain, nausea and vomiting (resolved).  Genitourinary: Negative for dysuria.  Musculoskeletal: Positive for myalgias. Negative for arthralgias.  Skin: Negative for rash.  Neurological: Positive for weakness and headaches.  Hematological: Negative for adenopathy.     Physical Exam Triage Vital Signs ED Triage Vitals  Enc Vitals Group     BP 02/13/21 0906 (!) 144/60     Pulse Rate 02/13/21 0906 100     Resp 02/13/21 0906 20     Temp 02/13/21 0906 98.1 F (36.7 C)     Temp Source 02/13/21 0906 Oral     SpO2 02/13/21 0906 100 %     Weight 02/13/21 0906 175 lb (79.4 kg)     Height 02/13/21 0906 5\' 3"  (1.6 m)     Head Circumference --      Peak Flow --      Pain Score 02/13/21 0905 10     Pain Loc --      Pain Edu? --      Excl. in GC? --    No data found.  Updated Vital Signs BP (!) 144/60 (BP Location: Left Arm)   Pulse 100   Temp 98.1 F (36.7 C) (Oral)   Resp 20   Ht 5\' 3"  (1.6 m)   Wt 175 lb (79.4 kg)   LMP 01/08/2021 (Exact Date)   SpO2 100%   Breastfeeding Unknown   BMI 31.00 kg/m        Physical Exam Vitals and nursing note reviewed.  Constitutional:      General: She is not in acute distress.    Appearance: Normal appearance. She is  well-developed. She is ill-appearing. She is not toxic-appearing.  HENT:     Head: Normocephalic and atraumatic.     Right Ear: Tympanic membrane, ear canal and external ear normal.     Left Ear: Tympanic membrane, ear canal and external ear normal.  Nose: Congestion present.     Mouth/Throat:     Mouth: Mucous membranes are moist.     Pharynx: Oropharynx is clear.  Eyes:     General: No scleral icterus.       Right eye: No discharge.        Left eye: No discharge.     Conjunctiva/sclera: Conjunctivae normal.  Cardiovascular:     Rate and Rhythm: Regular rhythm. Tachycardia present.     Heart sounds: Normal heart sounds.  Pulmonary:     Effort: Pulmonary effort is normal. No respiratory distress.     Breath sounds: Rhonchi (diffuse rhonchi throughout) present.  Abdominal:     Palpations: Abdomen is soft.     Tenderness: There is generalized abdominal tenderness.  Musculoskeletal:     Cervical back: Neck supple.  Skin:    General: Skin is dry.  Neurological:     General: No focal deficit present.     Mental Status: She is alert. Mental status is at baseline.     Motor: No weakness.     Gait: Gait normal.  Psychiatric:        Mood and Affect: Mood normal.        Behavior: Behavior normal.        Thought Content: Thought content normal.      UC Treatments / Results  Labs (all labs ordered are listed, but only abnormal results are displayed) Labs Reviewed  URINALYSIS, COMPLETE (UACMP) WITH MICROSCOPIC - Abnormal; Notable for the following components:      Result Value   APPearance CLOUDY (*)    Hgb urine dipstick MODERATE (*)    Bilirubin Urine MODERATE (*)    Ketones, ur 40 (*)    Protein, ur 100 (*)    Bacteria, UA MANY (*)    All other components within normal limits  CBC WITH DIFFERENTIAL/PLATELET - Abnormal; Notable for the following components:   WBC 11.9 (*)    Neutro Abs 8.7 (*)    All other components within normal limits  COMPREHENSIVE METABOLIC PANEL  - Abnormal; Notable for the following components:   Sodium 131 (*)    Potassium 2.9 (*)    CO2 21 (*)    Glucose, Bld 102 (*)    Total Protein 8.4 (*)    AST 60 (*)    ALT 50 (*)    All other components within normal limits  RESP PANEL BY RT-PCR (FLU A&B, COVID) ARPGX2    EKG   Radiology DG Chest 2 View  Result Date: 02/13/2021 CLINICAL DATA:  Cough, fever, shortness of breath EXAM: CHEST - 2 VIEW COMPARISON:  2013 FINDINGS: The heart size and mediastinal contours are within normal limits. Both lungs are clear. No pleural effusion. The visualized skeletal structures are unremarkable. IMPRESSION: No acute process in the chest. Electronically Signed   By: Guadlupe Spanish M.D.   On: 02/13/2021 10:20    Procedures Procedures (including critical care time)  Medications Ordered in UC Medications - No data to display  Initial Impression / Assessment and Plan / UC Course  I have reviewed the triage vital signs and the nursing notes.  Pertinent labs & imaging results that were available during my care of the patient were reviewed by me and considered in my medical decision making (see chart for details).   35 year old female presenting for 1 week history of fever, fatigue, body aches, cough, congestion, chest discomfort and breathing difficulty.  Also admits to loss of appetite and  nausea.  Vital signs are stable in the clinic.  Her blood pressure is elevated 144/60.  She is afebrile.  Oxygen saturation is 100% and pulse is elevated at 100 bpm.  She is ill-appearing, but not toxic.  She does have nasal congestion.  Has diffuse rhonchi throughout chest and cannot take a deep breath without coughing.  Chest generalized abdominal tenderness.  Respiratory panel obtained today to assess for possible influenza or COVID-19.  Respiratory panel is all negative.  Chest x-ray obtained today to assess for possible pneumonia.  Chest x-ray does not show any acute abnormality.  I did independently  reviewed the images.  Urinalysis obtained today to assess hydration status.  UA shows cloudy urine, moderate blood and bilirubin, ketones and protein.  Advised patient of results and suggested lab work and IV hydration therapy.  Patient declined IV hydration therapy and states that she will increase her fluid intake.  She did agree to lab work but stated that she could not stay for the results and would like to be called if there is any abnormality.  I did advised her that based on her lab work we may suggest that she go to the ED or be seen again.  Patient is understanding.  Suspect bronchitis due to her symptoms of fever, fatigue, body aches, cough and congestion as well as chest discomfort and breathing difficulty.  Treating at this time with azithromycin, Bromfed-DM, and albuterol inhaler as needed for shortness of breath.  I also did send Zofran for nausea.  CBC shows elevated white blood cells at 11.9.  CMP shows sodium decreased at 131, potassium decreased 2.9, AST elevated at 60 and ALT elevated at 50.  Call to try to review labs with patient but no answer.  We will try again.  I was able to reach patient and advised her of her lab results.  She has taken the medications as prescribed.  She plans to eat something and increase her fluid intake.  Advised her of the importance of replenishing her electrolytes.  Advised her that her lab work should be rechecked in the next 2 to 3 days.  Advised patient if she cannot see PCP then she should return to our clinic.  I did discuss with her that her liver enzymes are little elevated which could be consistent with a viral type infection or if the abdominal pain is getting worse we may need to order some imaging or her PCP may need to order some imaging to assess for possible hepatitis or gallbladder disease.  I thoroughly reviewed ED precautions with patient and advised her signs and symptoms to look out for regarding hypokalemia, shortness of breath, and  abdominal pain.   Final Clinical Impressions(s) / UC Diagnoses   Final diagnoses:  Acute bronchitis, unspecified organism  Cough  Loss of appetite  Non-intractable vomiting with nausea, unspecified vomiting type  Shortness of breath     Discharge Instructions     Your chest x-ray did not show any pneumonia, but your chest is not clear and is consistent with bronchitis.  I have sent an antibiotic in case this is a bacterial infection since you had a fever up to 102 degrees and are not feeling better after 1 week of conservative treatment.  I have also sent in a cough syrup.  You should increase your fluid intake and rest a lot.  The urinalysis shows that the urine is a little bit concentrated and there is presence of microscopic blood and protein.  We did offer you IV fluids today, but you state that you cannot receive them due to another obligation.  If you are feeling dehydrated and still weak, please return for reexamination and consideration of IV fluids.  Your white blood cell count is elevated indicating an infection.  I will call you with the results of your other labs if there is anything abnormal.  Please go to the emergency department if you have any continued fevers after 2 days, increased fatigue/weakness, increased breathing difficulty or increased abdominal pain.    ED Prescriptions    Medication Sig Dispense Auth. Provider   azithromycin (ZITHROMAX) 250 MG tablet Take 1 tablet (250 mg total) by mouth daily. Take first 2 tablets together, then 1 every day until finished. 6 tablet Eusebio Friendly B, PA-C   ondansetron (ZOFRAN ODT) 4 MG disintegrating tablet Take 1 tablet (4 mg total) by mouth every 8 (eight) hours as needed for up to 5 days for nausea or vomiting. 15 tablet Eusebio Friendly B, PA-C   brompheniramine-pseudoephedrine-DM 30-2-10 MG/5ML syrup Take 10 mLs by mouth 4 (four) times daily as needed for up to 7 days. 150 mL Eusebio Friendly B, PA-C   albuterol (VENTOLIN HFA) 108  (90 Base) MCG/ACT inhaler Inhale 1-2 puffs into the lungs every 6 (six) hours as needed for wheezing or shortness of breath. 1 g Shirlee Latch, PA-C     PDMP not reviewed this encounter.   Shirlee Latch, PA-C 02/13/21 1756

## 2021-02-13 NOTE — Discharge Instructions (Addendum)
Your chest x-ray did not show any pneumonia, but your chest is not clear and is consistent with bronchitis.  I have sent an antibiotic in case this is a bacterial infection since you had a fever up to 102 degrees and are not feeling better after 1 week of conservative treatment.  I have also sent in a cough syrup.  You should increase your fluid intake and rest a lot.  The urinalysis shows that the urine is a little bit concentrated and there is presence of microscopic blood and protein.  We did offer you IV fluids today, but you state that you cannot receive them due to another obligation.  If you are feeling dehydrated and still weak, please return for reexamination and consideration of IV fluids.  Your white blood cell count is elevated indicating an infection.  I will call you with the results of your other labs if there is anything abnormal.  Please go to the emergency department if you have any continued fevers after 2 days, increased fatigue/weakness, increased breathing difficulty or increased abdominal pain.  -Potassium is low. You need to drink Pedialyte and eat some bananas and other food. This should be re-checked in the next 2-3 days.   -If abdominal pain worsens or not feeling better soon, may need to go to ER or return for re-examination and further workup including more labs and more imaging.

## 2023-12-28 ENCOUNTER — Other Ambulatory Visit: Payer: Self-pay

## 2023-12-28 ENCOUNTER — Emergency Department
Admission: EM | Admit: 2023-12-28 | Discharge: 2023-12-28 | Disposition: A | Discharge status: Home / Self Care | Payer: BC Managed Care – PPO | Attending: Emergency Medicine | Admitting: Emergency Medicine

## 2023-12-28 DIAGNOSIS — M791 Myalgia, unspecified site: Secondary | ICD-10-CM | POA: Diagnosis present

## 2023-12-28 DIAGNOSIS — J101 Influenza due to other identified influenza virus with other respiratory manifestations: Secondary | ICD-10-CM | POA: Diagnosis not present

## 2023-12-28 DIAGNOSIS — Z20822 Contact with and (suspected) exposure to covid-19: Secondary | ICD-10-CM | POA: Insufficient documentation

## 2023-12-28 DIAGNOSIS — F17200 Nicotine dependence, unspecified, uncomplicated: Secondary | ICD-10-CM | POA: Diagnosis not present

## 2023-12-28 LAB — RESP PANEL BY RT-PCR (RSV, FLU A&B, COVID)  RVPGX2
Influenza A by PCR: POSITIVE — AB
Influenza B by PCR: NEGATIVE
Resp Syncytial Virus by PCR: NEGATIVE
SARS Coronavirus 2 by RT PCR: NEGATIVE

## 2023-12-28 LAB — POC URINE PREG, ED: Preg Test, Ur: NEGATIVE

## 2023-12-28 MED ORDER — ACETAMINOPHEN 500 MG PO TABS
1000.0000 mg | ORAL_TABLET | Freq: Once | ORAL | Status: AC
  Administered 2023-12-28: 1000 mg via ORAL
  Filled 2023-12-28: qty 2

## 2023-12-28 NOTE — Discharge Instructions (Signed)
 Follow-up with primary care provider if any continued problems or concerns.  Tylenol  or ibuprofen  as needed for fever, headache, body aches.  Increase fluids to stay hydrated.  You are contagious at this time.  If other family members began having same symptoms they most likely have also have the flu.

## 2023-12-28 NOTE — ED Provider Notes (Signed)
 [facility] Provider Note    Event Date/Time   First MD Initiated Contact with Patient 12/28/23 [phone]     (approximate)   History   URI   HPI  Isabel Weber is a 38 y.o. female   presents to the ED with complaint of feeling sick for the last 3 weeks.  Patient states that most recently she developed body aches, sore throat, sneezing and a fever.  She reports that she had a temperature of 102 last evening.  One of her coworkers tested positive for RSV.  Patient is a smoker but states that she has not been able to for the last couple of days.        Physical Exam   Triage Vital Signs: ED Triage Vitals  Encounter Vitals Group     BP 12/28/23 0836 (!) 159/97     Systolic BP Percentile --      Diastolic BP Percentile --      Pulse Rate 12/28/23 0836 96     Resp 12/28/23 0836 18     Temp 12/28/23 0836 (!) 102.5 F (39.2 C)     Temp Source 12/28/23 0836 Oral     SpO2 12/28/23 0836 100 %     Weight 12/28/23 0838 175 lb 0.7 oz (79.4 kg)     Height --      Head Circumference --      Peak Flow --      Pain Score 12/28/23 0837 10     Pain Loc --      Pain Education --      Exclude from Growth Chart --     Most recent vital signs: Vitals:   12/28/23 0836 12/28/23 1045  BP: (!) 159/97   Pulse: 96   Resp: 18   Temp: (!) 102.5 F (39.2 C) (!) 100.5 F (38.1 C)  SpO2: 100%      General: Awake, no distress.  Able to talk in complete sentences without any difficulty. CV:  Good peripheral perfusion.  Heart regular rate and rhythm. Resp:  Normal effort.  Lungs are clear bilaterally.  Initially there was a faint wheeze that was noted and cleared with cough. Abd:  No distention.  Other:     ED Results / Procedures / Treatments   Labs (all labs ordered are listed, but only abnormal results are displayed) Labs Reviewed  RESP PANEL BY RT-PCR (RSV, FLU A&B, COVID)  RVPGX2 - Abnormal; Notable for the following components:      Result Value    Influenza A by PCR POSITIVE (*)    All other components within normal limits  POC URINE PREG, ED     PROCEDURES:  Critical Care performed:   Procedures   MEDICATIONS ORDERED IN ED: Medications  acetaminophen  (TYLENOL ) tablet 1,000 mg (1,000 mg Oral Given 12/28/23 0841)     IMPRESSION / MDM / ASSESSMENT AND PLAN / ED COURSE  I reviewed the triage vital signs and the nursing notes.   Differential diagnosis includes, but is not limited to, COVID, influenza, RSV, viral illness, bronchitis, pneumonia.  38 year old female presents to the ED with complaint of upper respiratory symptoms along with a temperature of 102 at her home.  Patient had a temperature of 102.5 in triage.  She was given antipyretics and prior to discharge her temperature was 100.5.  Respiratory panel was positive for influenza patient was made aware.  Also a urine pregnancy test was done as patient has irregular menses  and has not had a period since 10/20/2023.  She is encouraged to follow-up with her PCP if any continued problems.  We discussed fluid hydration, Tylenol , ibuprofen  and that she is contagious at this time.      Patient's presentation is most consistent with acute illness / injury with system symptoms.  FINAL CLINICAL IMPRESSION(S) / ED DIAGNOSES   Final diagnoses:  Influenza A     Rx / DC Orders   ED Discharge Orders     None        Note:  This document was prepared using Dragon voice recognition software and may include unintentional dictation errors.   Saunders Shona CROME, PA-C 12/28/23 1402    Jossie Artist POUR, MD 12/28/23 [phone]

## 2023-12-28 NOTE — ED Triage Notes (Signed)
 C/O feeling sick for 3 weeks.  C/O body aches, chest pain, palpitaitons, temp 102 last night, sore throat, sneezing.  Ibuprofen  taken last night. States coworker has tested positive for RSV  AAOx3.  Skin warm and dry. NAD
